# Patient Record
Sex: Female | Born: 1960 | Race: White | Hispanic: No | Marital: Single | Smoking: Former smoker
Health system: Southern US, Community
[De-identification: ages and names within clinical notes are randomized; demographics above are authoritative.]

## PROBLEM LIST (undated history)

## (undated) DIAGNOSIS — T7840XA Allergy, unspecified, initial encounter: Secondary | ICD-10-CM

## (undated) DIAGNOSIS — F419 Anxiety disorder, unspecified: Secondary | ICD-10-CM

## (undated) DIAGNOSIS — R200 Anesthesia of skin: Secondary | ICD-10-CM

## (undated) DIAGNOSIS — F329 Major depressive disorder, single episode, unspecified: Secondary | ICD-10-CM

## (undated) DIAGNOSIS — G43909 Migraine, unspecified, not intractable, without status migrainosus: Secondary | ICD-10-CM

## (undated) DIAGNOSIS — F32A Depression, unspecified: Secondary | ICD-10-CM

## (undated) DIAGNOSIS — M1990 Unspecified osteoarthritis, unspecified site: Secondary | ICD-10-CM

## (undated) HISTORY — DX: Depression, unspecified: F32.A

## (undated) HISTORY — DX: Anxiety disorder, unspecified: F41.9

## (undated) HISTORY — DX: Allergy, unspecified, initial encounter: T78.40XA

## (undated) HISTORY — PX: WISDOM TOOTH EXTRACTION: SHX21

## (undated) HISTORY — DX: Anesthesia of skin: R20.0

## (undated) HISTORY — DX: Migraine, unspecified, not intractable, without status migrainosus: G43.909

## (undated) HISTORY — DX: Unspecified osteoarthritis, unspecified site: M19.90

## (undated) HISTORY — PX: NO PAST SURGERIES: SHX2092

## (undated) HISTORY — DX: Major depressive disorder, single episode, unspecified: F32.9

---

## 1998-03-16 ENCOUNTER — Other Ambulatory Visit: Admission: RE | Admit: 1998-03-16 | Discharge: 1998-03-16 | Payer: Self-pay | Admitting: Obstetrics and Gynecology

## 1999-05-15 ENCOUNTER — Other Ambulatory Visit: Admission: RE | Admit: 1999-05-15 | Discharge: 1999-05-15 | Payer: Self-pay | Admitting: Obstetrics and Gynecology

## 1999-05-15 ENCOUNTER — Encounter (INDEPENDENT_AMBULATORY_CARE_PROVIDER_SITE_OTHER): Payer: Self-pay

## 2000-05-15 ENCOUNTER — Other Ambulatory Visit: Admission: RE | Admit: 2000-05-15 | Discharge: 2000-05-15 | Payer: Self-pay | Admitting: Obstetrics and Gynecology

## 2001-06-24 ENCOUNTER — Other Ambulatory Visit: Admission: RE | Admit: 2001-06-24 | Discharge: 2001-06-24 | Payer: Self-pay | Admitting: Obstetrics and Gynecology

## 2002-07-08 ENCOUNTER — Other Ambulatory Visit: Admission: RE | Admit: 2002-07-08 | Discharge: 2002-07-08 | Payer: Self-pay | Admitting: Obstetrics and Gynecology

## 2002-10-14 ENCOUNTER — Other Ambulatory Visit: Admission: RE | Admit: 2002-10-14 | Discharge: 2002-10-14 | Payer: Self-pay | Admitting: Obstetrics and Gynecology

## 2003-05-10 ENCOUNTER — Other Ambulatory Visit: Admission: RE | Admit: 2003-05-10 | Discharge: 2003-05-10 | Payer: Self-pay | Admitting: Obstetrics and Gynecology

## 2003-08-02 ENCOUNTER — Other Ambulatory Visit: Admission: RE | Admit: 2003-08-02 | Discharge: 2003-08-02 | Payer: Self-pay | Admitting: Obstetrics and Gynecology

## 2004-01-23 ENCOUNTER — Other Ambulatory Visit: Admission: RE | Admit: 2004-01-23 | Discharge: 2004-01-23 | Payer: Self-pay | Admitting: Obstetrics and Gynecology

## 2004-09-06 ENCOUNTER — Other Ambulatory Visit: Admission: RE | Admit: 2004-09-06 | Discharge: 2004-09-06 | Payer: Self-pay | Admitting: Obstetrics and Gynecology

## 2005-10-14 ENCOUNTER — Other Ambulatory Visit: Admission: RE | Admit: 2005-10-14 | Discharge: 2005-10-14 | Payer: Self-pay | Admitting: Obstetrics and Gynecology

## 2006-11-21 LAB — CONVERTED CEMR LAB: Pap Smear: NORMAL

## 2007-11-21 ENCOUNTER — Encounter: Payer: Self-pay | Admitting: Internal Medicine

## 2007-11-26 ENCOUNTER — Encounter: Payer: Self-pay | Admitting: Internal Medicine

## 2007-11-29 ENCOUNTER — Ambulatory Visit: Payer: Self-pay | Admitting: Internal Medicine

## 2007-11-29 DIAGNOSIS — M2560 Stiffness of unspecified joint, not elsewhere classified: Secondary | ICD-10-CM | POA: Insufficient documentation

## 2007-11-29 DIAGNOSIS — R635 Abnormal weight gain: Secondary | ICD-10-CM | POA: Insufficient documentation

## 2007-11-29 DIAGNOSIS — M25519 Pain in unspecified shoulder: Secondary | ICD-10-CM | POA: Insufficient documentation

## 2007-11-29 DIAGNOSIS — R87619 Unspecified abnormal cytological findings in specimens from cervix uteri: Secondary | ICD-10-CM | POA: Insufficient documentation

## 2007-11-29 LAB — CONVERTED CEMR LAB
Anti Nuclear Antibody(ANA): NEGATIVE
Bilirubin Urine: NEGATIVE
Blood in Urine, dipstick: NEGATIVE
Glucose, Urine, Semiquant: NEGATIVE
Ketones, urine, test strip: NEGATIVE
Nitrite: NEGATIVE
Specific Gravity, Urine: >=1.03
Urobilinogen, UA: 0.2
WBC Urine, dipstick: NEGATIVE
pH: 5

## 2007-12-02 LAB — CONVERTED CEMR LAB
ALT: 19 units/L (ref 0–35)
AST: 20 units/L (ref 0–37)
Albumin: 4.1 g/dL (ref 3.5–5.2)
Alkaline Phosphatase: 77 units/L (ref 39–117)
BUN: 17 mg/dL (ref 6–23)
Basophils Absolute: 0 10*3/uL (ref 0.0–0.1)
Basophils Relative: 0.6 % (ref 0.0–1.0)
Bilirubin, Direct: 0.2 mg/dL (ref 0.0–0.3)
CO2: 30 meq/L (ref 19–32)
CRP, High Sensitivity: 3 (ref 0.00–5.00)
Calcium: 9.8 mg/dL (ref 8.4–10.5)
Chloride: 107 meq/L (ref 96–112)
Cholesterol: 199 mg/dL (ref 0–200)
Creatinine, Ser: 0.9 mg/dL (ref 0.4–1.2)
Eosinophils Absolute: 0.1 10*3/uL (ref 0.0–0.6)
Eosinophils Relative: 1.5 % (ref 0.0–5.0)
Free T4: 0.8 ng/dL (ref 0.6–1.6)
GFR calc Af Amer: 87 mL/min
GFR calc non Af Amer: 72 mL/min
Glucose, Bld: 87 mg/dL (ref 70–99)
HCT: 41.3 % (ref 36.0–46.0)
HDL: 57.7 mg/dL (ref >39.0)
Hemoglobin: 14 g/dL (ref 12.0–15.0)
LDL Cholesterol: 130 mg/dL — ABNORMAL HIGH (ref 0–99)
Lymphocytes Relative: 36.8 % (ref 12.0–46.0)
MCHC: 33.8 g/dL (ref 30.0–36.0)
MCV: 91.5 fL (ref 78.0–100.0)
Monocytes Absolute: 0.5 10*3/uL (ref 0.2–0.7)
Monocytes Relative: 8.3 % (ref 3.0–11.0)
Neutro Abs: 3.3 10*3/uL (ref 1.4–7.7)
Neutrophils Relative %: 52.8 % (ref 43.0–77.0)
Platelets: 290 10*3/uL (ref 150–400)
Potassium: 4.5 meq/L (ref 3.5–5.1)
RBC: 4.52 M/uL (ref 3.87–5.11)
RDW: 12.2 % (ref 11.5–14.6)
Rhuematoid fact SerPl-aCnc: <20 intl units/mL — ABNORMAL LOW (ref 0.0–20.0)
Sodium: 142 meq/L (ref 135–145)
TSH: 2.01 microintl units/mL (ref 0.35–5.50)
Total Bilirubin: 0.8 mg/dL (ref 0.3–1.2)
Total CHOL/HDL Ratio: 3.4
Total CK: 95 units/L (ref 7–177)
Total Protein: 6.6 g/dL (ref 6.0–8.3)
Triglycerides: 57 mg/dL (ref 0–149)
VLDL: 11 mg/dL (ref 0–40)
WBC: 6.1 10*3/uL (ref 4.5–10.5)

## 2007-12-07 ENCOUNTER — Encounter: Admission: RE | Admit: 2007-12-07 | Discharge: 2007-12-07 | Payer: Self-pay | Admitting: Internal Medicine

## 2007-12-13 ENCOUNTER — Telehealth: Payer: Self-pay | Admitting: Internal Medicine

## 2007-12-13 ENCOUNTER — Telehealth: Payer: Self-pay | Admitting: *Deleted

## 2007-12-14 ENCOUNTER — Telehealth (INDEPENDENT_AMBULATORY_CARE_PROVIDER_SITE_OTHER): Payer: Self-pay | Admitting: *Deleted

## 2007-12-23 ENCOUNTER — Telehealth: Payer: Self-pay | Admitting: Internal Medicine

## 2007-12-23 ENCOUNTER — Encounter: Payer: Self-pay | Admitting: Internal Medicine

## 2007-12-28 ENCOUNTER — Encounter: Payer: Self-pay | Admitting: Internal Medicine

## 2007-12-28 ENCOUNTER — Ambulatory Visit: Payer: Self-pay | Admitting: Internal Medicine

## 2007-12-28 ENCOUNTER — Other Ambulatory Visit: Admission: RE | Admit: 2007-12-28 | Discharge: 2007-12-28 | Payer: Self-pay | Admitting: Internal Medicine

## 2008-01-06 ENCOUNTER — Telehealth (INDEPENDENT_AMBULATORY_CARE_PROVIDER_SITE_OTHER): Payer: Self-pay | Admitting: *Deleted

## 2008-12-24 IMAGING — MG MM SCREEN MAMMOGRAM BILATERAL
4 series · 4 of 4 positions shown · non-contrast
Comparison: Prior studies.

DG SCREEN MAMMOGRAM BILATERAL
Bilateral CC and MLO view(s) were taken.

DIGITAL SCREENING MAMMOGRAM WITH CAD:

[R CC]
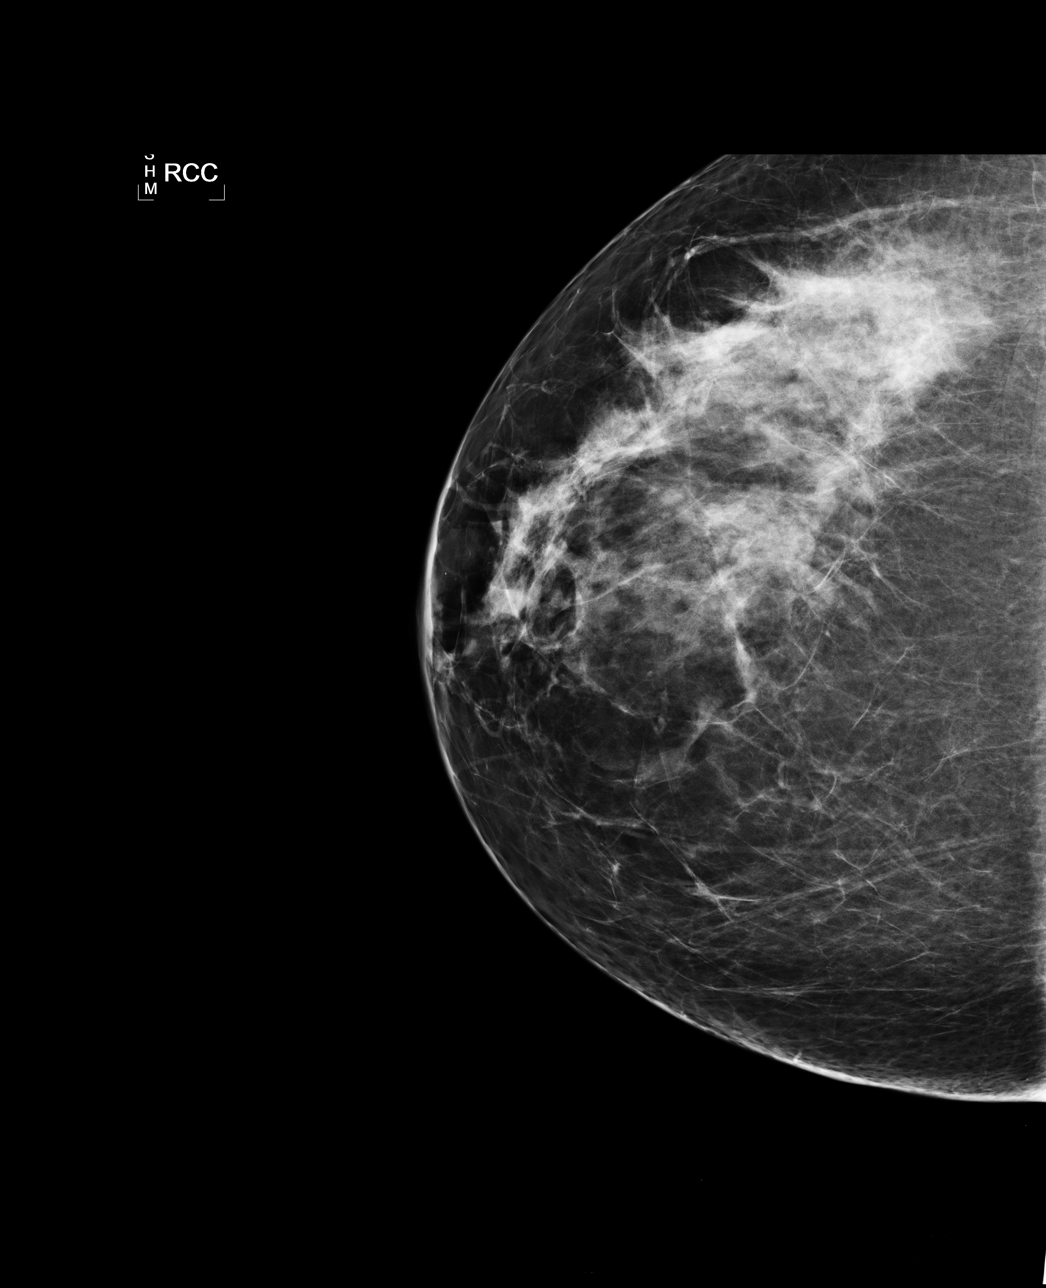

[L CC]
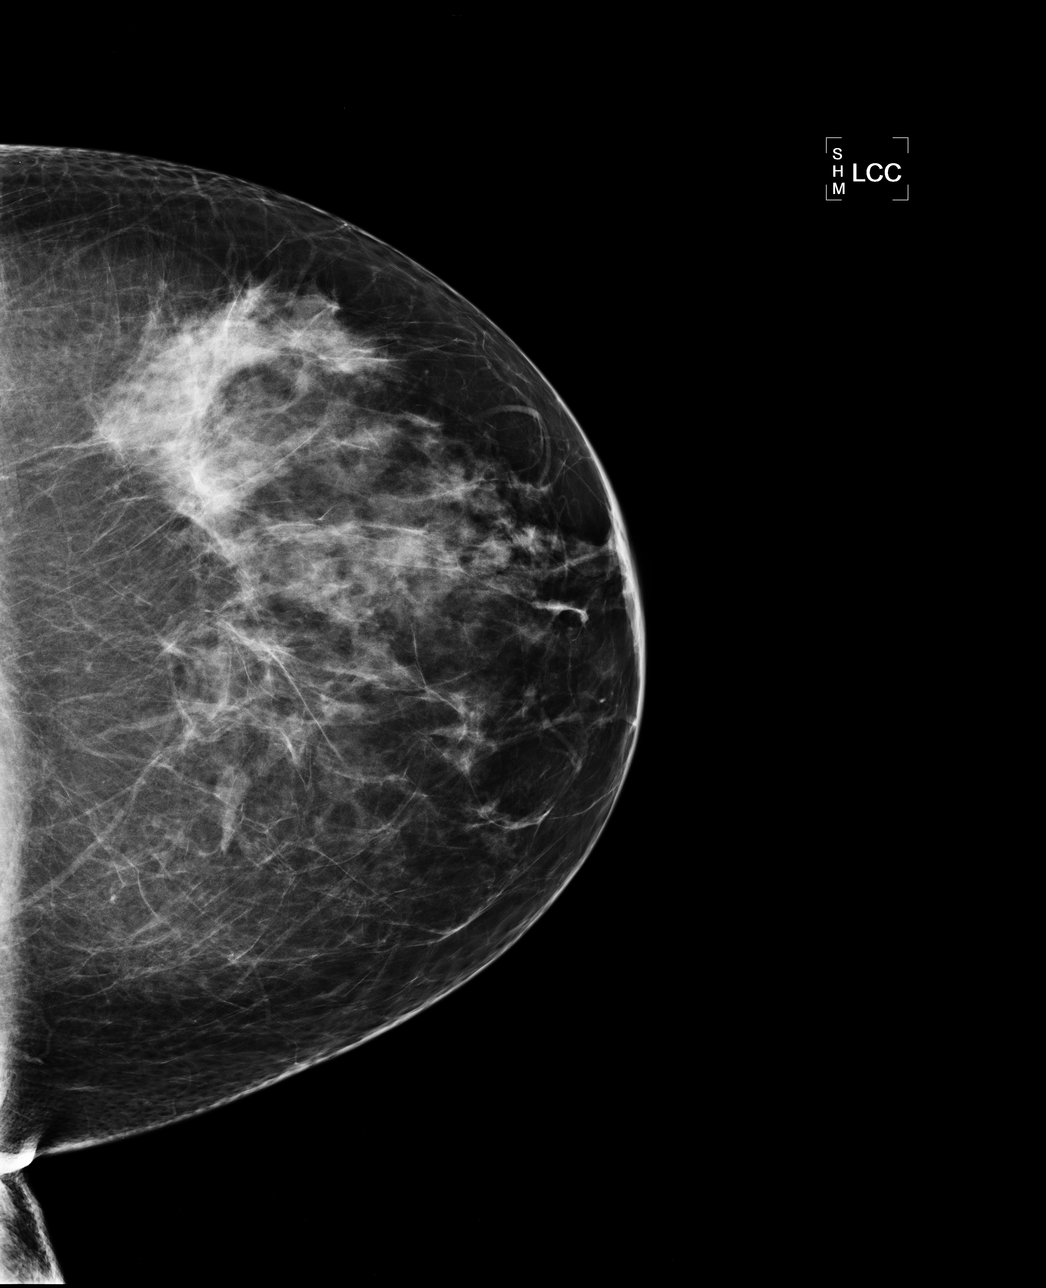

[L MLO]
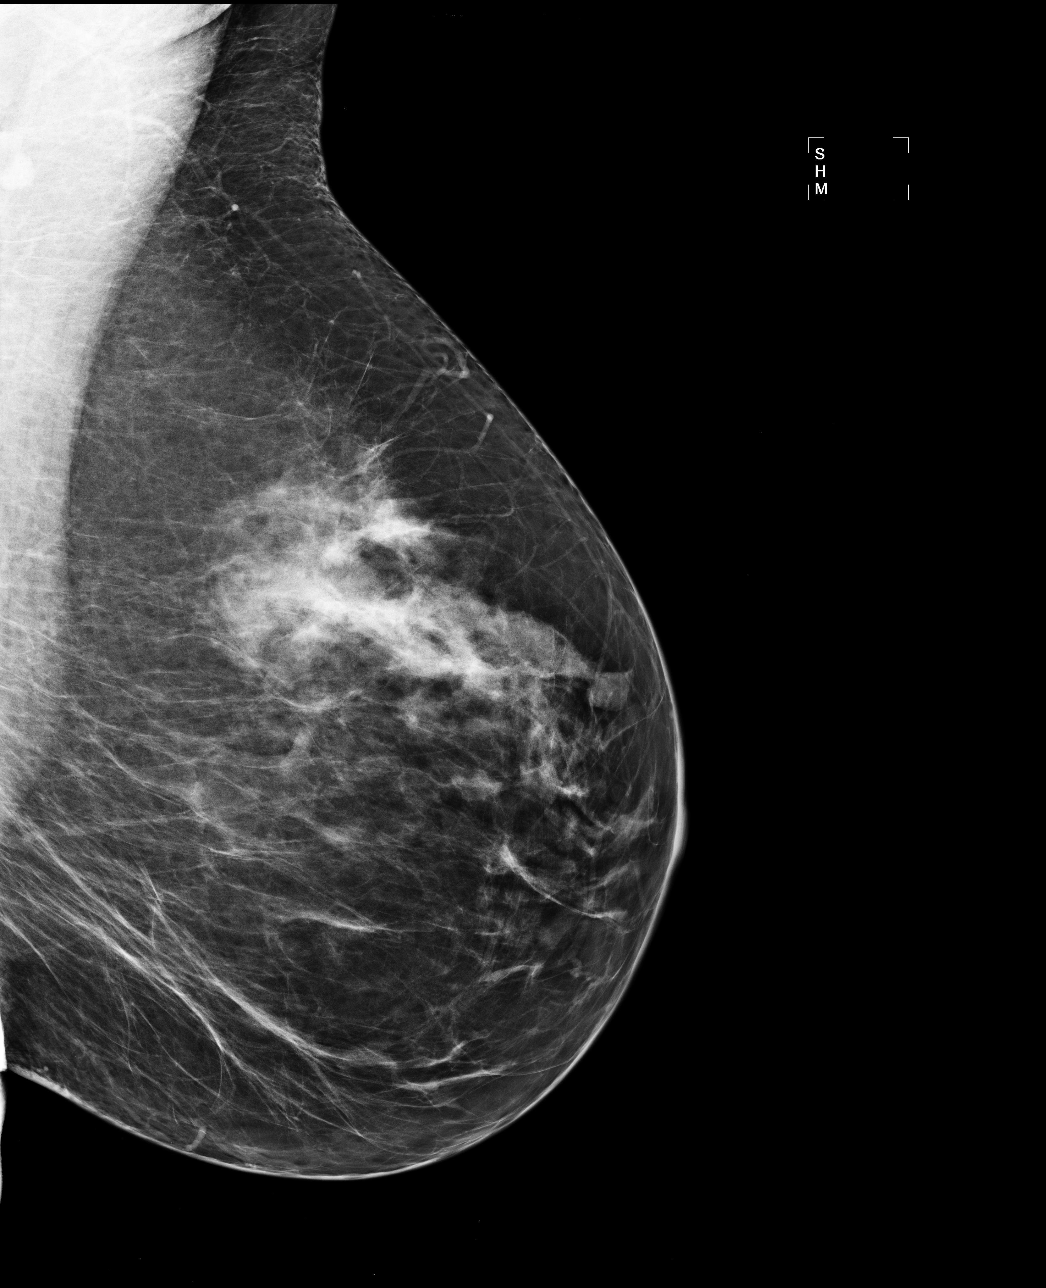

[R MLO]
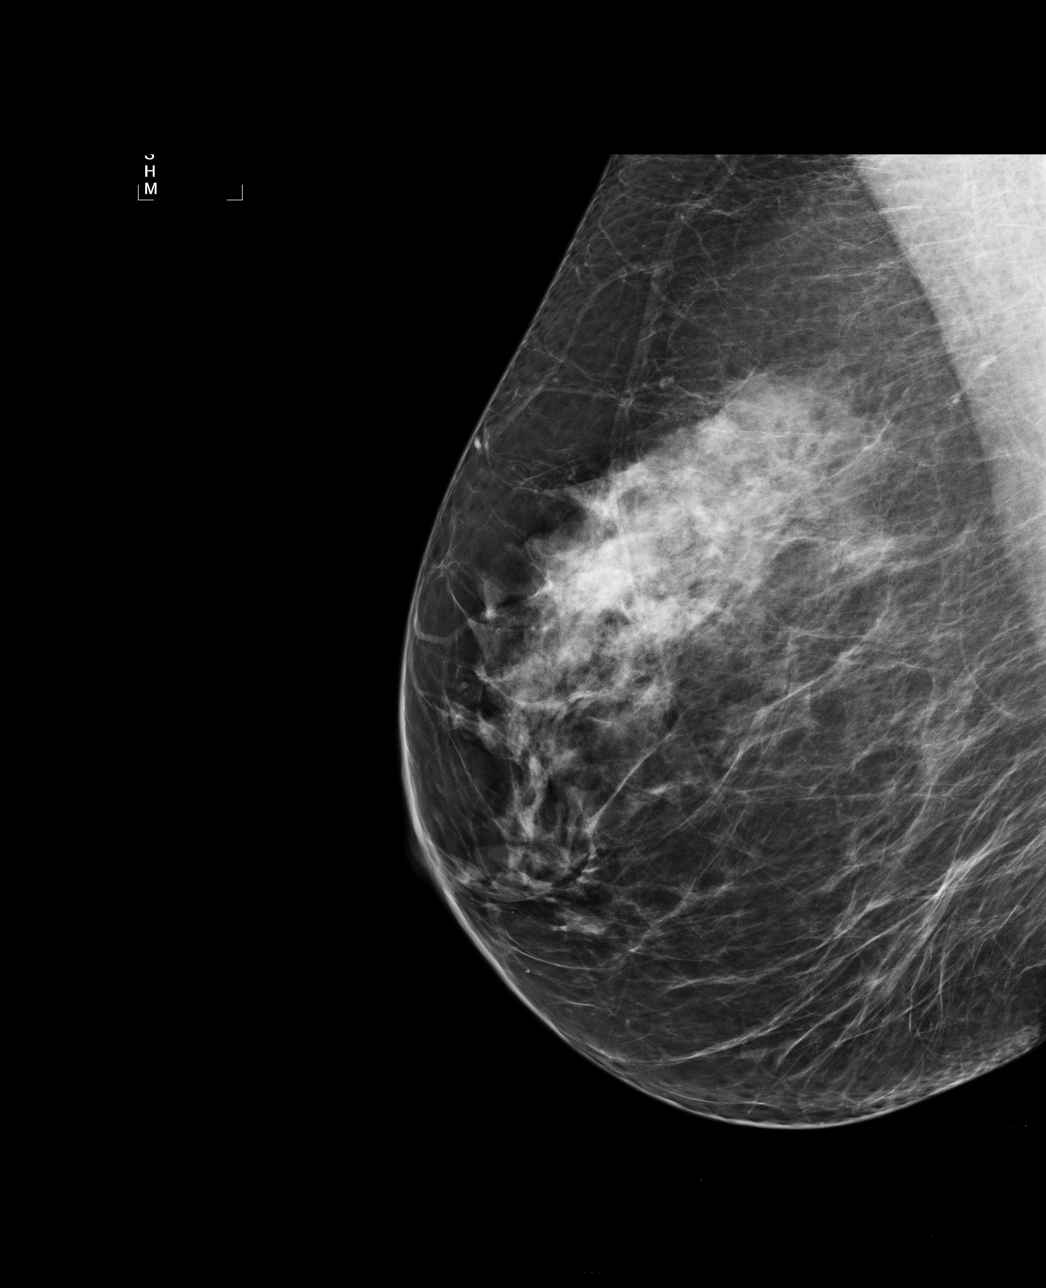

[4 of 4 positions shown; findings below may reference images not displayed]

The breast tissue is heterogeneously dense.  There is no dominant mass, architectural distortion or
calcification to suggest malignancy.
IMPRESSION: No mammographic evidence of malignancy.  Suggest yearly screening mammography.

ASSESSMENT: Negative - BI-RADS 1

Screening mammogram in 1 year.
ANALYZED BY COMPUTER AIDED DETECTION. , THIS PROCEDURE WAS A DIGITAL MAMMOGRAM.

## 2009-01-12 ENCOUNTER — Encounter: Admission: RE | Admit: 2009-01-12 | Discharge: 2009-01-12 | Payer: Self-pay | Admitting: Internal Medicine

## 2009-02-14 ENCOUNTER — Encounter: Payer: Self-pay | Admitting: Cardiology

## 2009-02-14 ENCOUNTER — Encounter: Payer: Self-pay | Admitting: Internal Medicine

## 2009-02-14 ENCOUNTER — Ambulatory Visit: Payer: Self-pay | Admitting: Internal Medicine

## 2009-02-14 ENCOUNTER — Other Ambulatory Visit: Admission: RE | Admit: 2009-02-14 | Discharge: 2009-02-14 | Payer: Self-pay | Admitting: Internal Medicine

## 2009-02-14 DIAGNOSIS — R0789 Other chest pain: Secondary | ICD-10-CM | POA: Insufficient documentation

## 2009-03-21 ENCOUNTER — Ambulatory Visit: Payer: Self-pay | Admitting: Cardiology

## 2009-04-27 ENCOUNTER — Ambulatory Visit: Payer: Self-pay

## 2009-04-27 ENCOUNTER — Encounter: Payer: Self-pay | Admitting: Cardiology

## 2009-12-26 ENCOUNTER — Telehealth: Payer: Self-pay | Admitting: *Deleted

## 2010-02-14 ENCOUNTER — Encounter: Admission: RE | Admit: 2010-02-14 | Discharge: 2010-02-14 | Payer: Self-pay | Admitting: Internal Medicine

## 2010-03-13 ENCOUNTER — Ambulatory Visit: Payer: Self-pay | Admitting: Internal Medicine

## 2010-03-13 LAB — CONVERTED CEMR LAB
ALT: 18 units/L (ref 0–35)
AST: 20 units/L (ref 0–37)
Albumin: 4 g/dL (ref 3.5–5.2)
Alkaline Phosphatase: 73 units/L (ref 39–117)
BUN: 19 mg/dL (ref 6–23)
Basophils Absolute: 0.1 10*3/uL (ref 0.0–0.1)
Basophils Relative: 0.8 % (ref 0.0–3.0)
Bilirubin Urine: NEGATIVE
Bilirubin, Direct: 0.1 mg/dL (ref 0.0–0.3)
Blood in Urine, dipstick: NEGATIVE
CO2: 31 meq/L (ref 19–32)
Calcium: 9.3 mg/dL (ref 8.4–10.5)
Chloride: 108 meq/L (ref 96–112)
Cholesterol: 188 mg/dL (ref 0–200)
Creatinine, Ser: 0.8 mg/dL (ref 0.4–1.2)
Eosinophils Absolute: 0.1 10*3/uL (ref 0.0–0.7)
Eosinophils Relative: 2.2 % (ref 0.0–5.0)
GFR calc non Af Amer: 77.69 mL/min (ref >60)
Glucose, Bld: 80 mg/dL (ref 70–99)
Glucose, Urine, Semiquant: NEGATIVE
HCT: 38.3 % (ref 36.0–46.0)
HDL: 60.5 mg/dL (ref >39.00)
Hemoglobin: 13.4 g/dL (ref 12.0–15.0)
Ketones, urine, test strip: NEGATIVE
LDL Cholesterol: 115 mg/dL — ABNORMAL HIGH (ref 0–99)
Lymphocytes Relative: 39.4 % (ref 12.0–46.0)
Lymphs Abs: 2.5 10*3/uL (ref 0.7–4.0)
MCHC: 34.8 g/dL (ref 30.0–36.0)
MCV: 95.1 fL (ref 78.0–100.0)
Monocytes Absolute: 0.4 10*3/uL (ref 0.1–1.0)
Monocytes Relative: 7.1 % (ref 3.0–12.0)
Neutro Abs: 3.2 10*3/uL (ref 1.4–7.7)
Neutrophils Relative %: 50.5 % (ref 43.0–77.0)
Nitrite: NEGATIVE
Platelets: 258 10*3/uL (ref 150.0–400.0)
Potassium: 4.8 meq/L (ref 3.5–5.1)
Protein, U semiquant: NEGATIVE
RBC: 4.03 M/uL (ref 3.87–5.11)
RDW: 13 % (ref 11.5–14.6)
Sodium: 144 meq/L (ref 135–145)
Specific Gravity, Urine: 1.025
TSH: 4.32 microintl units/mL (ref 0.35–5.50)
Total Bilirubin: 0.5 mg/dL (ref 0.3–1.2)
Total CHOL/HDL Ratio: 3
Total Protein: 6.8 g/dL (ref 6.0–8.3)
Triglycerides: 63 mg/dL (ref 0.0–149.0)
Urobilinogen, UA: 0.2
VLDL: 12.6 mg/dL (ref 0.0–40.0)
WBC Urine, dipstick: NEGATIVE
WBC: 6.3 10*3/uL (ref 4.5–10.5)
pH: 6

## 2010-03-19 ENCOUNTER — Ambulatory Visit: Payer: Self-pay | Admitting: Internal Medicine

## 2010-03-19 ENCOUNTER — Other Ambulatory Visit: Admission: RE | Admit: 2010-03-19 | Discharge: 2010-03-19 | Payer: Self-pay | Admitting: Internal Medicine

## 2010-03-19 DIAGNOSIS — N951 Menopausal and female climacteric states: Secondary | ICD-10-CM | POA: Insufficient documentation

## 2010-03-20 ENCOUNTER — Telehealth: Payer: Self-pay | Admitting: *Deleted

## 2010-03-22 LAB — CONVERTED CEMR LAB: Pap Smear: NEGATIVE

## 2010-03-26 ENCOUNTER — Telehealth: Payer: Self-pay | Admitting: Internal Medicine

## 2010-03-27 ENCOUNTER — Encounter (INDEPENDENT_AMBULATORY_CARE_PROVIDER_SITE_OTHER): Payer: Self-pay | Admitting: *Deleted

## 2010-04-01 ENCOUNTER — Encounter: Payer: Self-pay | Admitting: Gastroenterology

## 2010-04-08 ENCOUNTER — Encounter: Payer: Self-pay | Admitting: Internal Medicine

## 2010-04-09 ENCOUNTER — Encounter (INDEPENDENT_AMBULATORY_CARE_PROVIDER_SITE_OTHER): Payer: Self-pay | Admitting: *Deleted

## 2010-04-11 ENCOUNTER — Telehealth: Payer: Self-pay | Admitting: Internal Medicine

## 2010-04-11 ENCOUNTER — Ambulatory Visit: Payer: Self-pay | Admitting: Gastroenterology

## 2010-04-11 ENCOUNTER — Telehealth: Payer: Self-pay | Admitting: *Deleted

## 2010-04-19 ENCOUNTER — Ambulatory Visit: Payer: Self-pay | Admitting: Gastroenterology

## 2010-04-19 HISTORY — PX: COLONOSCOPY: SHX174

## 2010-05-06 ENCOUNTER — Telehealth: Payer: Self-pay | Admitting: *Deleted

## 2010-05-08 ENCOUNTER — Telehealth: Payer: Self-pay | Admitting: *Deleted

## 2010-07-29 ENCOUNTER — Telehealth: Payer: Self-pay | Admitting: *Deleted

## 2010-08-05 ENCOUNTER — Encounter: Payer: Self-pay | Admitting: Internal Medicine

## 2010-08-07 ENCOUNTER — Encounter: Payer: Self-pay | Admitting: *Deleted

## 2010-10-20 LAB — CONVERTED CEMR LAB
ALT: 18 units/L (ref 0–35)
AST: 27 units/L (ref 0–37)
Albumin: 4.1 g/dL (ref 3.5–5.2)
Alkaline Phosphatase: 73 units/L (ref 39–117)
BUN: 22 mg/dL (ref 6–23)
Basophils Absolute: 0 10*3/uL (ref 0.0–0.1)
Basophils Relative: 0.2 % (ref 0.0–3.0)
Bilirubin, Direct: 0 mg/dL (ref 0.0–0.3)
Blood in Urine, dipstick: NEGATIVE
CO2: 29 meq/L (ref 19–32)
Calcium: 9.2 mg/dL (ref 8.4–10.5)
Chloride: 113 meq/L — ABNORMAL HIGH (ref 96–112)
Cholesterol: 164 mg/dL (ref 0–200)
Creatinine, Ser: 0.8 mg/dL (ref 0.4–1.2)
Eosinophils Absolute: 0.1 10*3/uL (ref 0.0–0.7)
Eosinophils Relative: 1.4 % (ref 0.0–5.0)
GFR calc non Af Amer: 81.42 mL/min (ref >60)
Glucose, Bld: 74 mg/dL (ref 70–99)
Glucose, Urine, Semiquant: NEGATIVE
HCT: 38.6 % (ref 36.0–46.0)
HDL: 52.3 mg/dL (ref >39.00)
Hemoglobin: 13.4 g/dL (ref 12.0–15.0)
LDL Cholesterol: 99 mg/dL (ref 0–99)
Lymphocytes Relative: 31.5 % (ref 12.0–46.0)
Lymphs Abs: 2 10*3/uL (ref 0.7–4.0)
MCHC: 34.7 g/dL (ref 30.0–36.0)
MCV: 94 fL (ref 78.0–100.0)
Monocytes Absolute: 0.4 10*3/uL (ref 0.1–1.0)
Monocytes Relative: 6 % (ref 3.0–12.0)
Neutro Abs: 4 10*3/uL (ref 1.4–7.7)
Neutrophils Relative %: 60.9 % (ref 43.0–77.0)
Nitrite: NEGATIVE
Platelets: 240 10*3/uL (ref 150.0–400.0)
Potassium: 4 meq/L (ref 3.5–5.1)
Protein, U semiquant: NEGATIVE
RBC: 4.1 M/uL (ref 3.87–5.11)
RDW: 12.2 % (ref 11.5–14.6)
Sodium: 144 meq/L (ref 135–145)
Specific Gravity, Urine: >=1.03
TSH: 2.57 microintl units/mL (ref 0.35–5.50)
Total Bilirubin: 0.8 mg/dL (ref 0.3–1.2)
Total CHOL/HDL Ratio: 3
Total Protein: 6.8 g/dL (ref 6.0–8.3)
Triglycerides: 63 mg/dL (ref 0.0–149.0)
Urobilinogen, UA: 0.2
VLDL: 12.6 mg/dL (ref 0.0–40.0)
WBC Urine, dipstick: NEGATIVE
WBC: 6.5 10*3/uL (ref 4.5–10.5)
pH: 5

## 2010-10-24 NOTE — Assessment & Plan Note (Signed)
Summary: pap and pelvic per dr.p to use a rov slot/nta   Vital Signs:  Patient Profile:   50 Years Old Female Height:     63 inches Weight:      170 pounds Pulse rate:   72 / minute BP sitting:   100 / 60  (right arm) Cuff size:   regular  Vitals Entered By: Romualdo Bolk, CMA (December 28, 2007 8:17 AM)                 Chief Complaint:  Pap and Pelvic.  History of Present Illness: Melody Duran is here for a pap. LMP: 5 years ago.  NO gyne problems  at present  see past note for ov. Has seen ortho and going to pt for shoulder   needs order for back pt as insurance wont take it from ortho!.   Still trying to ride horse on weekend but sometimes a problem.. Feels achy all over and stiff in am .. does have a lot of tick exposures [place] in [place]   no hx of tick rashes   To go over labs today      Prior Medications Reviewed Using: Patient Recall  Updated Prior Medication List: NAPROXEN DR 500 MG  TBEC (NAPROXEN) 1 two times a day  Current Allergies (reviewed today): ! [facility]  Past Medical History:    Reviewed history from 11/29/2007 and no changes required:       hx abnormal over a year ago. pap     normal x 3 and now on yearly PAPs.       prob had chickenpox as a child       Migraines  Past Surgical History:    Reviewed history from 11/29/2007 and no changes required:       Denies surgical history   Family History:    Reviewed history from 11/29/2007 and no changes required:       gm   age 67 Massive MI       Both parents with MIs   Mom 79s  smokes        Mom breast cancer   42       Both parents  had colon cancer.         PGF dm    MI        GM had arthritis              No sibs        ?RA in mom.         Family History of CAD Female 1st degree relative <60       Family History of CAD Female 1st degree relative <50       Family History of Colon CA 1st degree relative <60  Social History:    Reviewed history from 11/29/2007 and no changes  required:       Single       Former Smoker       Alcohol use-yes   rare       Drug use-no       Regular exercise-no       hh1 4 dogs and 2 horses  not allergic       rides horses    Review of Systems  The patient denies anorexia, fever, weight loss, severe indigestion/heartburn, hematuria, muscle weakness, and abnormal bleeding.     Physical Exam  General:     Well-developed,well-nourished,in no acute distress; alert,appropriate and  cooperative throughout examination Head:     normocephalic and atraumatic.   Neck:     No deformities, masses, or tenderness noted. Breasts:     No mass, nodules, thickening, tenderness, bulging, retraction, inflamation, nipple discharge or skin changes noted.   Lungs:     normal respiratory effort, no intercostal retractions, and no accessory muscle use.   Heart:     normal rate and regular rhythm.   Abdomen:     Bowel sounds positive,abdomen soft and non-tender without masses, organomegaly or hernias noted. Rectal:     No external abnormalities noted. Normal sphincter tone. No rectal masses or tenderness. Genitalia:     Pelvic Exam:        External: normal female genitalia without lesions or masses        Vagina: normal without lesions or masses        Cervix: normal without lesions or masses        Adnexa: normal bimanual exam without masses or fullness        Uterus: normal by palpation        Pap smear: performed Msk:     no joint swelling, no joint warmth, and no redness over joints.   Neurologic:     grossly nf Skin:     area of tick bit llb without rash and no imbedded tick  Cervical Nodes:     no anterior cervical adenopathy and no posterior cervical adenopathy.   Axillary Nodes:     No palpable lymphadenopathy Inguinal Nodes:     No significant adenopathy Additional Exam:     see labs normal crp is 5 range    Impression & Recommendations:  Problem # 1:  JOINT STIFFNESS (ICD-719.50) no explanation and but rec pt for  back and shoulder and ok to decrease horse riding for now and see if sleep in proves and thus pain ..consider pheum appt    no stigmata of lyme although has outdoor exposure.  consider check vit d level if continuing but supplement currently  Problem # 2:  ROUTINE GYNECOLOGICAL EXAMINATION (ICD-V72.31) nomral exam today  Problem # 3:  PAP SMEAR, ABNORMAL (ICD-795.00)  hx of normal now Assessment: Comment Only  Complete Medication List: 1)  Naproxen Dr 500 Mg Tbec (Naproxen) .Marland Kitchen.. 1 two times a day   Patient Instructions: 1)  continue with pt for now  and call or ov in 1 months consider  rheum appt as aneeded 2)  take vit d 1000-2000iu per day also . 3)  call if fever rash other change in status    ]

## 2010-10-24 NOTE — Miscellaneous (Signed)
Summary: direct colon//family hx of colon cancer--ch.  Clinical Lists Changes  Medications: Added new medication of MOVIPREP 100 GM  SOLR (PEG-KCL-NACL-NASULF-NA ASC-C) As directed - Signed Rx of MOVIPREP 100 GM  SOLR (PEG-KCL-NACL-NASULF-NA ASC-C) As directed;  #1 x 0;  Signed;  Entered by: Clide Cliff RN;  Authorized by: Mardella Layman MD [facility];  Method used: Electronically to [facility] [id number]*, [address] [place], [place], [place]  [address], Ph: [phone] or [phone], Fax: [phone] Observations: Added new observation of ALLERGY REV: Done (04/11/2010 12:46)    Prescriptions: MOVIPREP 100 GM  SOLR (PEG-KCL-NACL-NASULF-NA ASC-C) As directed  #1 x 0   Entered by:   Clide Cliff RN   Authorized by:   Mardella Layman MD [facility]   Signed by:   Clide Cliff RN on 04/11/2010   Method used:   Electronically to        [facility] [address] [phone]* (retail)       [address]       [place], [place]  [address]       Ph: [phone] or [phone]       Fax: [phone]   RxID:   [id number]

## 2010-10-24 NOTE — Consult Note (Signed)
Summary: Consultation Report  Consultation Report   Imported By: Kassie Mends 12/28/2007 09:25:57  _____________________________________________________________________  External Attachment:    Type:   Image     Comment:   Dr Penni Bombard note

## 2010-10-24 NOTE — Procedures (Signed)
Summary: Colonoscopy  Patient: Melody Duran Note: All result statuses are Final unless otherwise noted.  Tests: (1) Colonoscopy (COL)   COL Colonoscopy           DONE     [facility]     [address]     [place], [place]  [address]           COLONOSCOPY PROCEDURE REPORT           PATIENT:  Melody Duran  MR#:  [id number]     BIRTHDATE:  06-16-1961, 49 yrs. old  GENDER:  female     ENDOSCOPIST:  Vania Rea. Jarold Motto, MD, [facility]     REF. BY:  Neta Mends. Panosh, M.D.     PROCEDURE DATE:  04/19/2010     PROCEDURE:  Higher-risk screening colonoscopy G0105           ASA CLASS:  Class II     INDICATIONS:  Elevated Risk Screening BOTH PARENTS WITH HX. OF     COLON CA.     MEDICATIONS:   Fentanyl 50 mcg IV, Versed 6 mg IV           DESCRIPTION OF PROCEDURE:   After the risks benefits and     alternatives of the procedure were thoroughly explained, informed     consent was obtained.  Digital rectal exam was performed and     revealed no abnormalities.   The LB [id number] endoscope     was introduced through the anus and advanced to the cecum, which     was identified by both the appendix and ileocecal valve, without     limitations.  The quality of the prep was excellent, using     MoviPrep.  The instrument was then slowly withdrawn as the colon     was fully examined.     <<PROCEDUREIMAGES>>           FINDINGS:  No polyps or cancers were seen.  This was otherwise a     normal examination of the colon.   Retroflexed views in the rectum     revealed no abnormalities.    The scope was then withdrawn from     the patient and the procedure completed.           COMPLICATIONS:  None     ENDOSCOPIC IMPRESSION:     1) No polyps or cancers     2) Otherwise normal examination     RECOMMENDATIONS:     1) Given your significant family history of colon cancer, you     should have a repeat colonoscopy in 5 years     REPEAT EXAM:  No           ______________________________  Vania Rea. Jarold Motto, MD, [facility]           CC:           n.     eSIGNED:   Vania Rea. Cliffton Spradley at 04/19/2010 04:01 PM           Adela Lank, [id number]  Note: An exclamation mark (!) indicates a result that was not dispersed into the flowsheet. Document Creation Date: 04/19/2010 4:03 PM _______________________________________________________________________  (1) Order result status: Final Collection or observation date-time: 04/19/2010 15:53 Requested date-time:  Receipt date-time:  Reported date-time:  Referring Physician:   Ordering Physician: Sheryn Bison [phone]) Specimen Source:  Source: Launa Grill Order Number: [id number] Lab site:   Appended Document: Colonoscopy  Clinical Lists Changes  Observations: Added new observation of COLONNXTDUE: 03/2015 (04/19/2010 16:38)

## 2010-10-24 NOTE — Miscellaneous (Signed)
Summary: Flu Shot/[facility]  Flu Shot/[facility]   Imported By: Maryln Gottron 08/12/2010 12:19:56  _____________________________________________________________________  External Attachment:    Type:   Image     Comment:   External Document

## 2010-10-24 NOTE — Assessment & Plan Note (Signed)
Summary: cpx/pap/pt will come in fasting/njr   Vital Signs:  Patient profile:   50 year old female Menstrual status:  postmenopausal Height:      63 inches Weight:      164 pounds BMI:     29.16 Pulse rate:   66 / minute BP sitting:   120 / 80  (right arm) Cuff size:   regular  Vitals Entered By: Romualdo Bolk, CMA (Feb 14, 2009 10:46 AM) CC: CPX with pap LMP - Character: 5 years ago Menarche (age onset): 12 years   days  Menstrual Status postmenopausal Last PAP Result Normal   History of Present Illness: Melody Duran comes in [place] for CPX  .  Interim hx   :  ocass chest tightness and hard to breath and anxiety.  ? if panic attack. No cough  or racing heart noted.    Lasted about  3-4 minutes and felt bad later  .    carrying bucket of   water for horse.  riding . 3 times   sitting watching tv.   Assoicated sob but no sweating.  no radiation . felt like pressure and squeezing and made her feel scared.  No hx of panic attacks and not in family   Also  horse slipped in wet and she fell hurting her back and sore . walking ok and feels as she will improve  just stiff.   NO er visit s or other changes since last visit .     Preventive Screening-Counseling & Management  Alcohol-Tobacco     Alcohol drinks/day: 0     Smoking Status: quit     Year Quit: 20 years ago  Caffeine-Diet-Exercise     Caffeine use/day: 1     Does Patient Exercise: yes     Type of exercise: walking  Hep-HIV-STD-Contraception     Dental Visit-last 6 months yes     Sun Exposure-Excessive: yes   tanning beds      Sun Exposure Counseling: to decrease sun exposure  Safety-Violence-Falls     Seat Belt Use: yes     Firearms in the Home: firearms in the home     Firearm Counseling: to practice firearm safety     Smoke Detectors: yes      Blood Transfusions:  no.    EKG  Procedure date:  02/14/2009  Findings:      Normal sinus rhythm with rate of:  67  Current Medications (verified): 1)   Naproxen Dr 500 Mg  Tbec (Naproxen) .Marland Kitchen.. 1 Two Times A Day  Allergies (verified): 1)  ! Effexor  Past History:  Past medical, surgical, family and social histories (including risk factors) reviewed, and no changes noted (except as noted below).  Past Medical History: Reviewed history from 11/29/2007 and no changes required. hx abnormal over a year ago. pap     normal x 3 and now on yearly PAPs. prob had chickenpox as a child Migraines  Past Surgical History: Reviewed history from 11/29/2007 and no changes required. Denies surgical history  Family History: Reviewed history from 11/29/2007 and no changes required. Mgm   age 54 Massive MI Both parents with MIs   Mom 76s  smokes  Mom breast cancer   48 Both parents  had colon cancer.   PGF dm    MI  GM had arthritis No sibs  ?RA in mom.   Family History of CAD Female 1st degree relative <60 Family History of CAD Female 1st degree  relative <50 Family History of Colon CA 1st degree relative <60  Social History: Reviewed history from 12/28/2007 and no changes required. 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 Caffeine use/day:  1 Does Patient Exercise:  yes Dental Care w/in 6 mos.:  yes Sun Exposure-Excessive:  yes   tanning beds  Seat Belt Use:  yes Blood Transfusions:  no  Review of Systems  The patient denies anorexia, fever, weight loss, weight gain, vision loss, decreased hearing, hoarseness, syncope, peripheral edema, prolonged cough, headaches, hemoptysis, abdominal pain, melena, hematochezia, severe indigestion/heartburn, hematuria, incontinence, muscle weakness, transient blindness, unusual weight change, abnormal bleeding, enlarged lymph nodes, angioedema, and breast masses.   Physical Exam General Appearance: well developed, well nourished, no acute distress Eyes: conjunctiva  and lids normal, PERRLA, EOMI,  WNL Ears, Nose, Mouth, Throat: TM clear, nares clear, oral exam WNL Neck: supple, no lymphadenopathy, no thyromegaly, no JVD Respiratory: clear to auscultation and percussion, respiratory effort normal Cardiovascular: regular rate and rhythm, S1-S2, no murmur, rub or gallop, no bruits, peripheral pulses normal and symmetric, no cyanosis, clubbing, edema or varicosities Chest: no scars, masses, tenderness; no asymmetry, skin changes, nipple discharge   Gastrointestinal: soft, non-tender; no hepatosplenomegaly, masses; active bowel sounds all quadrants, guaiac negative stool; no masses, tenderness, hemorrhoids  Genitourinary: no vaginal discharge, lesions; no masses or tenderness pap done  Lymphatic: no cervical, axillary or inguinal adenopathy Musculoskeletal: gait normal, muscle tone and strength WNL, no joint swelling, effusions, discoloration, crepitus  she does have some back pain but gait ok  Skin: clear, good turgor, color WNL, no rashes, lesions, or ulcerations Neurologic: normal mental status, normal reflexes, normal strength, sensation, and motion Psychiatric: alert; oriented to person, place and time Other Exam:  EKG NSR no acut changes    Impression & Recommendations:  Problem # 1:  PHYSICAL EXAMINATION (ICD-V70.0) Discussed nutrition,exercise,diet,healthy weight, vitamin D and calcium.  Orders: Venipuncture (60454) TLB-BMP (Basic Metabolic Panel-BMET) (80048-METABOL) TLB-Lipid Panel (80061-LIPID) TLB-Hepatic/Liver Function Pnl (80076-HEPATIC) TLB-CBC Platelet - w/Differential (85025-CBCD) TLB-TSH (Thyroid Stimulating Hormone) (84443-TSH) UA Dipstick w/o Micro (automated)  (81003)  Problem # 2:  ROUTINE GYNECOLOGICAL EXAMINATION (ICD-V72.31)  pap done  Orders: Obtaining Screening PAP Smear (U9811)  Problem # 3:  CHEST DISCOMFORT (ICD-786.59)  although exam and ekg are normal  . one episode of discomfort  was with acitivity and doesnt  seem like panic  and are assoicate with dypnea .  . she has somewhat  premature menopause  early 37s and  stong family hx of Premature CAD in both parents  I think she should have more evaluation  and options discussed   .   Will refer for myoview  and if continuing  card consult.  get labs today  also  Orders: EKG w/ Interpretation (93000) Cardiology Referral (Cardiology)  Problem # 4:  FAMILY HISTORY OF CAD FEMALE 1ST DEGREE RELATIVE <60 (ICD-V16.49)  mom had mi  in early 78s  father early 47s   Orders: EKG w/ Interpretation (93000) Cardiology Referral (Cardiology)  Complete Medication List: 1)  Naproxen Dr 500 Mg Tbec (Naproxen) .Marland Kitchen.. 1 two times a day  Patient Instructions: 1)  You will be informed of lab results when available.  2)  Will plan  cardiology evaluation ( stress test)  although you EKG is normal. 3)  take 1000iu vit d per day.  Preventive Care Screening  Last Tetanus Booster:    Date:  09/23/2007    Results:  Historical   Prior Values:    Pap Smear:  Normal (11/21/2006)    Mammogram:  ASSESSMENT: Negative - BI-RADS 1^MM DIGITAL SCREENING (01/12/2009)   Laboratory Results   Urine Tests    Routine Urinalysis   Color: yellow Appearance: Clear Glucose: negative   (Normal Range: Negative) Bilirubin: 1+   (Normal Range: Negative) Ketone: 1+   (Normal Range: Negative) Spec. Gravity: >=1.030   (Normal Range: 1.003-1.035) Blood: negative   (Normal Range: Negative) pH: 5.0   (Normal Range: 5.0-8.0) Protein: negative   (Normal Range: Negative) Urobilinogen: 0.2   (Normal Range: 0-1) Nitrite: negative   (Normal Range: Negative) Leukocyte Esterace: negative   (Normal Range: Negative)    Comments: Rita Ohara  Feb 14, 2009 1:23 PM

## 2010-10-24 NOTE — Assessment & Plan Note (Signed)
Summary: cpx----pap//ccm   Vital Signs:  Patient profile:   50 year old female Menstrual status:  postmenopausal Height:      63 inches Weight:      165 pounds BMI:     29.33 Pulse rate:   66 / minute BP sitting:   120 / 80  (left arm) Cuff size:   regular  Vitals Entered By: Romualdo Bolk, CMA (AAMA) (March 19, 2010 10:01 AM) CC: CPX with pap- Pt wants to discuss going back on Brand Effexor and wants a 3 month supply for this. Pt is wanting this for hot flashes and help with mood swing.   History of Present Illness: Melody Duran comes in today  for preventive visit  Since last visit  here  there have been no major changes in health status   but   she  has been toled by coworkers that she would do better on effexor .     Was on effexor 2 years ago  for flushes and it really helped  and now   getting laid off and  has some mood issues but no hopelessness    and somehot flushes  Generic  didnt work  for her.  had hives   and  se  on the  IR  ...did badly . Needs extended release   Preventive Care Screening  Prior Values:    Pap Smear:  NEGATIVE FOR INTRAEPITHELIAL LESIONS OR MALIGNANCY. (02/14/2009)    Mammogram:  ASSESSMENT: Negative - BI-RADS 1^MM DIGITAL SCREENING (02/14/2010)    Last Tetanus Booster:  Historical (09/23/2007)   Preventive Screening-Counseling & Management  Alcohol-Tobacco     Alcohol drinks/day: <1     Alcohol type: wine     Smoking Status: quit     Year Quit: 20 years ago  Caffeine-Diet-Exercise     Caffeine use/day: 1     Does Patient Exercise: yes     Type of exercise: walking  Hep-HIV-STD-Contraception     Dental Visit-last 6 months yes     Sun Exposure-Excessive: yes   tanning beds      Sun Exposure Counseling: to decrease sun exposure  Safety-Violence-Falls     Seat Belt Use: yes     Firearms in the Home: firearms in the home     Firearm Counseling: to practice firearm safety     Smoke Detectors: yes  Current Medications  (verified): 1)  Multivitamins   Tabs (Multiple Vitamin) .Marland Kitchen.. 1 Tab By Mouth Once Daily 2)  Glucosamine .Marland Kitchen.. 1 Tab By Mouth Once Daily 3)  Vitamin D3 1000 Unit Tabs (Cholecalciferol) .... Tab By Mouth Once Daily 4)  Fish Oil   Oil (Fish Oil) .Marland Kitchen.. 1 Tab By Mouth Once Daily  Allergies (verified): 1)  ! Effexor  Past History:  Past medical, surgical, family and social histories (including risk factors) reviewed, and no changes noted (except as noted below).  Past Medical History: Current Problems:  CHEST DISCOMFORT (ICD-786.59) JOINT STIFFNESS (ICD-719.50) SHOULDER PAIN, RIGHT (ICD-719.41) PAP SMEAR, ABNORMAL (ICD-795.00) ? of MENOPAUSE, EARLY (ICD-627.2) WEIGHT GAIN (ICD-783.1) 'hx abnormal over a year ago. pap     normal x 3 and now on yearly PAPs. prob had chickenpox as a child Migraines stress echo negative  2010   Past Surgical History: Reviewed history from 11/29/2007 and no changes required. Denies surgical history  Past History:  Care Management: Cardiology: 2010  Family History: Reviewed history from 02/14/2009 and no changes required. Mgm   age 75 [place] MI  Both parents with MIs   Mom 23s  smokes  Mom breast cancer   48 Both parents  had colon cancer.   PGF dm    MI  GM had arthritis No sibs  ?RA in mom.   Family History of CAD Female 1st degree relative <60 Family History of CAD Female 1st degree relative <50 Family History of Colon CA 1st degree relative <60  Social History: Reviewed history from 02/14/2009 and no changes required. 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  Marinda Elk.   Review of Systems  The patient denies anorexia, fever, weight loss, weight gain, vision loss, decreased hearing, hoarseness, syncope, dyspnea on exertion, peripheral edema, prolonged cough, headaches,  hemoptysis, abdominal pain, melena, hematochezia, severe indigestion/heartburn, hematuria, incontinence, genital sores, muscle weakness, suspicious skin lesions, transient blindness, difficulty walking, unusual weight change, abnormal bleeding, enlarged lymph nodes, angioedema, and breast masses.         heat   intertrigo  rash    saw derm and rx with antifungal moodiness some   and hot flushes   Physical Exam  Genitalia:  Pelvic Exam:        External: normal female genitalia without lesions or masses        Vagina: normal without lesions or masses        Cervix: normal without lesions or masses        Adnexa: normal bimanual exam without masses or fullness        Uterus: normal by palpation        Pap smear: performed Physical Exam General Appearance: well developed, well nourished, no acute distress Eyes: conjunctiva and lids normal, PERRLA, EOMI, WNL Ears, Nose, Mouth, Throat: TM clear, nares clear, oral exam WNL Neck: supple, no lymphadenopathy, no thyromegaly, no JVD Respiratory: clear to auscultation and percussion, respiratory effort normal Cardiovascular: regular rate and rhythm, S1-S2, no murmur, rub or gallop, no bruits, peripheral pulses normal and symmetric, no cyanosis, clubbing, edema or varicosities Chest: no scars, masses, tenderness; no asymmetry, skin changes, nipple discharge   Gastrointestinal: soft, non-tender; no hepatosplenomegaly, masses; active bowel sounds all quadrants,  Genitourinary: no vaginal discharge, lesions; no masses or tenderness Lymphatic: no cervical, axillary or inguinal adenopathy Musculoskeletal: gait normal, muscle tone and strength WNL, no joint swelling, effusions, discoloration, crepitus  Skin: sun changes , good turgor, color WNL,  lesions, or ulcerations  mid chest with flat red spots   intertrigo. no weeping  or scaling.  Neurologic: normal mental status, normal reflexes, normal strength, sensation, and motion Psychiatric: alert; oriented to  person, place and time Other Exam:  labs reviewed .   nl     Impression & Recommendations:  Problem # 1:  PREVENTIVE HEALTH CARE (ICD-V70.0) Discussed nutrition,exercise,diet,healthy weight, vitamin D and calcium.   Problem # 2:  ROUTINE GYNECOLOGICAL EXAMINATION (ICD-V72.31)  pap done   Orders: Pap Smear, Thin Prep ( Collection of) (Z6109)  Problem # 3:  MENOPAUSE-RELATED VASOMOTOR SYMPTOMS, HOT FLASHES (ICD-627.2) plius mood    reasonable to go back on effexor  and disc  options  of dosing  .  Complete Medication List: 1)  Multivitamins Tabs (Multiple vitamin) .Marland Kitchen.. 1 tab by mouth once daily 2)  Glucosamine  .Marland Kitchen.. 1 tab by mouth once daily 3)  Vitamin D3 1000 Unit Tabs (  Cholecalciferol) .... Tab by mouth once daily 4)  Fish Oil Oil (Fish oil) .Marland Kitchen.. 1 tab by mouth once daily 5)  Effexor Xr 37.5 Mg Xr24h-cap (Venlafaxine hcl) .Marland Kitchen.. 1 by mouth once daily  increase to 2 by mouth once daily  after 1 week 6)  Effexor Xr 75 Mg Xr24h-cap (Venlafaxine hcl) .Marland Kitchen.. 1 by mouth once daily  Other Orders: Gastroenterology Referral (GI)  Patient Instructions: 1)  begin effexor  37.5 xr  and increase as directed  . Call or ROV   in 2-3 weeks or as needed then we can adjust dose accordingly . 2)  Avoid tanning beds . 3)  exercise and healthy eating  . 4)  Sent referral for colonoscopy screening Prescriptions: EFFEXOR XR 75 MG XR24H-CAP (VENLAFAXINE HCL) 1 by mouth once daily  #90 x 1   Entered and Authorized by:   Madelin Headings MD   Signed by:   Madelin Headings MD on 03/19/2010   Method used:   Print then Give to Patient   RxID:   [id number] EFFEXOR XR 37.5 MG XR24H-CAP (VENLAFAXINE HCL) 1 by mouth once daily  increase to 2 by mouth once daily  after 1 week  #60 x 1   Entered and Authorized by:   Madelin Headings MD   Signed by:   Madelin Headings MD on 03/19/2010   Method used:   Print then Give to Patient   RxID:   [id number]

## 2010-10-24 NOTE — Letter (Signed)
Summary: [facility] Instructions  [facility]  [address] [place], [place] [address]   Phone: [phone]  Fax: [phone]       Melody Duran    March 29, 1961    MRN: [id number]        Procedure Day Dorna Bloom:  Farrell Ours  04/19/10     Arrival Time:  2:30pm     Procedure Time:  3:30pm    Location of Procedure:                    _X_  [facility] (4th Floor)                       PREPARATION FOR COLONOSCOPY WITH MOVIPREP   Starting 5 days prior to your procedure  SUNDAY 07/24  do not eat nuts, seeds, popcorn, corn, beans, peas,  salads, or any raw vegetables.  Do not take any fiber supplements (e.g. Metamucil, Citrucel, and Benefiber).  THE DAY BEFORE YOUR PROCEDURE         DATE:  THURSDAY  07/28  1.  Drink clear liquids the entire day-NO SOLID FOOD  2.  Do not drink anything colored red or purple.  Avoid juices with pulp.  No orange juice.  3.  Drink at least 64 oz. (8 glasses) of fluid/clear liquids during the day to prevent dehydration and help the prep work efficiently.  CLEAR LIQUIDS INCLUDE: Water Jello Ice Popsicles Tea (sugar ok, no milk/cream) Powdered fruit flavored drinks Coffee (sugar ok, no milk/cream) Gatorade Juice: apple, white grape, white cranberry  Lemonade Clear bullion, consomm, broth Carbonated beverages (any kind) Strained chicken noodle soup Hard Candy                             4.  In the morning, mix first dose of MoviPrep solution:    Empty 1 Pouch A and 1 Pouch B into the disposable container    Add lukewarm drinking water to the top line of the container. Mix to dissolve    Refrigerate (mixed solution should be used within 24 hrs)  5.  Begin drinking the prep at 5:00 p.m. The MoviPrep container is divided by 4 marks.   Every 15 minutes drink the solution down to the next mark (approximately 8 oz) until the full liter is complete.   6.  Follow completed prep with 16 oz of clear liquid of your choice (Nothing  red or purple).  Continue to drink clear liquids until bedtime.  7.  Before going to bed, mix second dose of MoviPrep solution:    Empty 1 Pouch A and 1 Pouch B into the disposable container    Add lukewarm drinking water to the top line of the container. Mix to dissolve    Refrigerate  THE DAY OF YOUR PROCEDURE      DATE:  FRIDAY  07/29  Beginning at  10:30 a.m. (5 hours before procedure):         1. Every 15 minutes, drink the solution down to the next mark (approx 8 oz) until the full liter is complete.  2. Follow completed prep with 16 oz. of clear liquid of your choice.    3. You may drink clear liquids until 1:30pm  (2 HOURS BEFORE PROCEDURE).   MEDICATION INSTRUCTIONS  Unless otherwise instructed, you should take regular prescription medications with a small sip of water   as early as  possible the morning of your procedure.           OTHER INSTRUCTIONS  You will need a responsible adult at least 50 years of age to accompany you and drive you home.   This person must remain in the waiting room during your procedure.  Wear loose fitting clothing that is easily removed.  Leave jewelry and other valuables at home.  However, you may wish to bring a book to read or  an iPod/MP3 player to listen to music as you wait for your procedure to start.  Remove all body piercing jewelry and leave at home.  Total time from sign-in until discharge is approximately 2-3 hours.  You should go home directly after your procedure and rest.  You can resume normal activities the  day after your procedure.  The day of your procedure you should not:   Drive   Make legal decisions   Operate machinery   Drink alcohol   Return to work  You will receive specific instructions about eating, activities and medications before you leave.    The above instructions have been reviewed and explained to me by   Melody Duran, RN______________________    I fully understand and can  verbalize these instructions _____________________________ Date _________

## 2010-10-24 NOTE — Progress Notes (Signed)
Summary: refills  Phone Note Refill Request Call back at Home Phone [phone] Message from:  Melody Duran---live call  Refills Requested: Medication #1:  WELLBUTRIN XL 150 MG XR24H-TAB one by mouth daily.  Medication #2:  AMBIEN 10 MG TABS 1 by mouth at bedtime [facility]..Need 90 day supply.  Initial call taken by: Warnell Forester,  July 29, 2010 12:53 PM Caller: Melody Duran  Follow-up for Phone Call        ok to refill 90 days     x 2 wellbutrin  x 1 ambien  If doing well  then  plan check up in June 2012   . Follow-up by: Madelin Headings MD,  July 29, 2010 5:30 PM  Additional Follow-up for Phone Call Additional follow up Details #1::        Rx sent to pharmacy for wellbutrin and rx called in for [facility]  Additional Follow-up by: Romualdo Bolk, CMA (AAMA),  July 30, 2010 1:36 PM    Prescriptions: AMBIEN 10 MG TABS (ZOLPIDEM TARTRATE) 1 by mouth at bedtime  #90 x 0   Entered by:   Romualdo Bolk, CMA (AAMA)   Authorized by:   Madelin Headings MD   Signed by:   Romualdo Bolk, CMA (AAMA) on 07/30/2010   Method used:   Telephoned to ...       [facility]  [address] [phone]* (retail)       [address]       [place], [place]  [address]       Ph: [phone] or [phone]       Fax: [phone]   RxID:   [id number] WELLBUTRIN XL 150 MG XR24H-TAB (BUPROPION HCL) one by mouth daily  #90 x 1   Entered by:   Romualdo Bolk, CMA (AAMA)   Authorized by:   Madelin Headings MD   Signed by:   Romualdo Bolk, CMA (AAMA) on 07/30/2010   Method used:   Electronically to        [facility] [address] [phone]* (retail)       [address]       [place], [place]  [address]       Ph: [phone] or [phone]       Fax: [phone]   RxID:   [id number]

## 2010-10-24 NOTE — Progress Notes (Signed)
Summary: [facility] called req Wellbutrin 90 day supply  Phone Note From Pharmacy Call back at [phone] [facility]  - [place]     Caller: [facility] [address] #[phone]* Summary of Call: Pt req 90 day supply of Wellbutrin. The original script was for #30. Pls rewrite script and send for #90.  Req was faxed on Monday 05/06/10.  Initial call taken by: Lucy Antigua,  May 08, 2010 2:30 PM  Follow-up for Phone Call        Rx sent to pharmacy. Follow-up by: Romualdo Bolk, CMA Duncan Dull),  May 08, 2010 2:47 PM    Prescriptions: WELLBUTRIN XL 150 MG XR24H-TAB (BUPROPION HCL) one by mouth daily  #90 x 0   Entered by:   Romualdo Bolk, CMA (AAMA)   Authorized by:   Madelin Headings MD   Signed by:   Romualdo Bolk, CMA (AAMA) on 05/08/2010   Method used:   Electronically to        [facility] [address] [phone]* (retail)       [address]       [place], [place]  [address]       Ph: [phone] or [phone]       Fax: [phone]   RxID:   [id number]

## 2010-10-24 NOTE — Progress Notes (Signed)
Summary: [employer] referral  Phone Note Call from Patient Call back at [phone]   Caller: Patient Summary of Call: Pt called back stating that she seen another md at that office and they need a paper referral for physical therapy because of her ins. I'm going to send this message to our [facility] so she can do the Ins referral. Initial call taken by: Romualdo Bolk, CMA,  December 14, 2007 1:51 PM  Follow-up for Phone Call        [facility] to do [place] referral for physical therapy per ortho.  Follow-up by: Romualdo Bolk, CMA,  December 14, 2007 1:52 PM  Additional Follow-up for Phone Call Additional follow up Details #1::        patient called this am wants to know when she will get her referral.  Says she has been calling for over a week and expects to hear something from someone today.   ..................................................................Marland KitchenRoselle Locus  December 16, 2007 9:09 AM    Additional Follow-up for Phone Call Additional follow up Details #2::    Referral faxed Follow-up by: Florentina Addison,  December 17, 2007 9:23 AM

## 2010-10-24 NOTE — Progress Notes (Signed)
Summary: new rx nneded  Phone Note Call from Patient Call back at Work Phone [phone]   Caller: Patient---live call Summary of Call: Having hot flashes again. Would like to go back on Efferor---lowest mg. two times a day. Dr Huntley Dec rx'd in the past. Pharmacy is [facility].  Initial call taken by: Warnell Forester,  December 26, 2009 12:16 PM  Follow-up for Phone Call        Left message to call back. We have that pt is allergic to effexor. Follow-up by: Romualdo Bolk, CMA Duncan Dull),  December 26, 2009 12:45 PM  Additional Follow-up for Phone Call Additional follow up Details #1::        Spoke to pt and she was allergic to generic brand effexor. I told pt that she would need a rov to come in and discuss this. Pt states that she would wait until her cpx to discuss this.  Additional Follow-up by: Romualdo Bolk, CMA (AAMA),  December 26, 2009 2:45 PM

## 2010-10-24 NOTE — Letter (Signed)
Summary: Pre Visit No Show Letter  [facility]  [address] [place], [place] [address]   Phone: [phone]  Fax: [phone]        April 01, 2010 MRN: [id number]    [facility] [address] [place], [place]  [address]    Dear Ms. Marchant,   We have been unable to reach you by phone concerning the pre-procedure visit that you missed on 04/01/10. For this reason,your procedure scheduled on 04/15/10 has been cancelled. Our scheduling staff will gladly assist you with rescheduling your appointments at a more convenient time. Please call our office at [phone] between the hours of 8:00am and 5:00pm, press option #2 to reach an appointment scheduler. Please consider updating your contact numbers at this time so that we can reach you by phone in the future with schedule changes or results.    Thank you,    Wyona Almas RN [facility]

## 2010-10-24 NOTE — Letter (Signed)
Summary: Previsit letter  [facility]  [address] [place], [place] [address]   Phone: [phone]  Fax: [phone]       03/27/2010 MRN: [id number]  [facility] [address] [place], [place]  [address]  Dear Ms. Restivo,  Welcome to the Gastroenterology Division at [facility].    You are scheduled to see a nurse for your pre-procedure visit on 04-01-10 at 1:00p.m. on the 3rd floor at [facility], [address]  We ask that you try to arrive at our office 15 minutes prior to your appointment time to allow for check-in.  Your nurse visit will consist of discussing your medical and surgical history, your immediate family medical history, and your medications.    Please bring a complete list of all your medications or, if you prefer, bring the medication bottles and we will list them.  We will need to be aware of both prescribed and over the counter drugs.  We will need to know exact dosage information as well.  If you are on blood thinners (Coumadin, Plavix, Aggrenox, Ticlid, etc.) please call our office today/prior to your appointment, as we need to consult with your physician about holding your medication.   Please be prepared to read and sign documents such as consent forms, a financial agreement, and acknowledgement forms.  If necessary, and with your consent, a friend or relative is welcome to sit-in on the nurse visit with you.  Please bring your insurance card so that we may make a copy of it.  If your insurance requires a referral to see a specialist, please bring your referral form from your primary care physician.  No co-pay is required for this nurse visit.     If you cannot keep your appointment, please call [phone] to cancel or reschedule prior to your appointment date.  This allows [place] the opportunity to schedule an appointment for another patient in need of care.    Thank you for choosing [facility] for your medical needs.   We appreciate the opportunity to care for you.  Please visit [place] at our website  to learn more about our practice.                     Sincerely.                                                                                                                   The Gastroenterology Division

## 2010-10-24 NOTE — Progress Notes (Signed)
Summary: refill  Phone Note Refill Request Message from:  Patient on April 11, 2010 9:29 AM  Refills Requested: Medication #1:  WELLBUTRIN XL 150 MG XR24H-TAB one by mouth daily. Initial call taken by: Kern Reap CMA Duncan Dull),  April 11, 2010 9:29 AM    Prescriptions: WELLBUTRIN XL 150 MG XR24H-TAB (BUPROPION HCL) one by mouth daily  #30 x 3   Entered by:   Kern Reap CMA (AAMA)   Authorized by:   Madelin Headings MD   Signed by:   Kern Reap CMA (AAMA) on 04/11/2010   Method used:   Electronically to        [facility] [address] [phone]* (retail)       [address]       [place], [place]  [address]       Ph: [phone] or [phone]       Fax: [phone]   RxID:   [id number]

## 2010-10-24 NOTE — Progress Notes (Signed)
Summary: colonoscopy  Phone Note Call from Patient Call back at Home Phone [phone]   Summary of Call: patient would like to know if insurance will colonoscopy?  Dr Fabian Sharp was going to code the colonoscopy "different than the norm". Initial call taken by: Kern Reap CMA Duncan Dull),  April 11, 2010 1:46 PM  Follow-up for Phone Call        Spoke with pt and told her that she needs to either speak with [facility] about this or her Ins company. Because we don't have the codes or the cost at our office to know what her ins will cover. Follow-up by: Romualdo Bolk, CMA (AAMA),  April 12, 2010 10:47 AM

## 2010-10-24 NOTE — Miscellaneous (Signed)
  Clinical Lists Changes  Medications: Removed medication of EFFEXOR XR 75 MG XR24H-CAP (VENLAFAXINE HCL) 1 by mouth once daily Removed medication of EFFEXOR XR 37.5 MG XR24H-CAP (VENLAFAXINE HCL) 1 by mouth once daily  increase to 2 by mouth once daily  after 1 week Added new medication of WELLBUTRIN XL 150 MG XR24H-TAB (BUPROPION HCL) one by mouth daily - Signed Rx of WELLBUTRIN XL 150 MG XR24H-TAB (BUPROPION HCL) one by mouth daily;  #30 x 0;  Signed;  Entered by: Lynann Beaver CMA;  Authorized by: Madelin Headings MD;  Method used: Electronically to [facility] [address] [phone]*, [address], [place], [place], [place]  [address], Ph: [phone] or [phone], Fax: [phone]    Prescriptions: WELLBUTRIN XL 150 MG XR24H-TAB (BUPROPION HCL) one by mouth daily  #30 x 0   Entered by:   Lynann Beaver CMA   Authorized by:   Madelin Headings MD   Signed by:   Lynann Beaver CMA on 04/08/2010   Method used:   Electronically to        [facility] [address] [phone]* (retail)       [address]       [place], [place]  [address]       Ph: [phone] or [phone]       Fax: [phone]   RxID:   [id number]

## 2010-10-24 NOTE — Assessment & Plan Note (Signed)
Summary: NEW PT/WILL FAST/PT REQUEST A PAP/CCM   Vital Signs:  Patient Profile:   50 Years Old Female Height:     63 inches Weight:      172 pounds BMI:     30.58 Pulse rate:   78 / minute BP sitting:   120 / 80  (left arm) Cuff size:   regular  Vitals Entered By: Romualdo Bolk, CMA (November 29, 2007 8:44 AM)                 Chief Complaint:  New Pt.  History of Present Illness: Melody Duran is here to get establish and discuss aches and pains.  Pt would also like a yearly exam if possible.  Last chlolesterol check last year and was ok. Doesn't eat very healthy.  Has been  on diet pills  at time.  Phenteramine.    Per Dr Henderson Cloud.  2 years ago.  Used to do [employer] without the meetings. Not recently.   Had been on prednisone x 1 month.    Hard to lose weight Eats fast food a lot.   tries to eat healthy.   Now has right shoulder pain x 4 months   No injury except ? onset after pulling mats out of horse trailer and does hay baling.   Is r handed  .   Uses mouse a lot.  Some popping in shoulder .  Self rx ibuprofen 800   after 1 week stopped because of gi side effect .    uses heat.   Very stiff in am    .  some better but hard to lift over her head.   6 months of stiffness in am.   No particular joints Hands bad.    Very stiff after lots of activity but this is new.            Prior Medications Reviewed Using: Patient Recall  Prior Medication List:  No prior medications documented  Current Allergies (reviewed today): No known allergies   Past Medical History:    hx abnormal over a year ago. pap     normal x 3 and now on yearly PAPs.    prob had chickenpox as a child    Migraines  Past Surgical History:    Denies surgical history   Family History:    gm   age 27 Massive MI    Both parents with MIs   Mom 3s  smokes     Mom breast cancer   49    Both parents  had colon cancer.      PGF dm    MI     GM had arthritis        No sibs     ?RA in  mom.      Family History of CAD Female 1st degree relative <60    Family History of CAD Female 1st degree relative <50    Family History of Colon CA 1st degree relative <60  Social History:    Single    Former Smoker    Alcohol use-yes   rare    Drug use-no    Regular exercise-no    hh1 4 dogs and 2 horses  not allergic   Risk Factors:  Tobacco use:  quit    Year quit:  20 years ago Drug use:  no Alcohol use:  yes    Type:  wine    Comments:  socially Exercise:  no  Mammogram History:     Date of Last Mammogram:  11/21/2006    Results:  Normal Bilateral   PAP Smear History:     Date of Last PAP Smear:  11/21/2006    Results:  Normal    Review of Systems  The patient denies anorexia, fever, weight loss, chest pain, dyspnea on exhertion, peripheral edema, prolonged cough, abdominal pain, melena, hematochezia, and hematuria.         no unexplained rashes   Physical Exam  General:     alert, well-developed, well-nourished, and well-hydrated.   Head:     normocephalic and atraumatic.   Eyes:     pupils equal and pupils round.   Nose:     no external erythema and no nasal discharge.   Mouth:     pharynx pink and moist and no erythema.   Neck:     No deformities, masses, or tenderness noted.no thyromegaly.   Lungs:     normal respiratory effort, no intercostal retractions, no accessory muscle use, and normal breath sounds.   Heart:     normal rate, regular rhythm, no murmur, no gallop, and physiological split S2.   Abdomen:     Bowel sounds positive,abdomen soft and non-tender without masses, organomegaly or hernias noted. Msk:     no redness over joints.  right shoulder with pain or stiffness on elevation above neutral and pain with internal rotation   no atrophy  or weakness otherwise  Pulses:     present  without delay Extremities:     no cce  Neurologic:     non focal  Skin:     turgor normal, color normal, no rashes, no ecchymoses, and no petechiae.     Cervical Nodes:     no anterior cervical adenopathy and no posterior cervical adenopathy.   Psych:     Oriented X3, normally interactive, good eye contact, and not anxious appearing.      Impression & Recommendations:  Problem # 1:  JOINT STIFFNESS (ICD-719.50) recent onset ? erlated to weight gain  or other r/o inflammatory arthritis etc.  Problem # 2:  SHOULDER PAIN, RIGHT (ICD-719.41) tendinitis not resolving and rec ortho consult    Orders: Orthopedic Referral (Ortho)   Problem # 3:  WEIGHT GAIN (ICD-783.1) felt secondary to prednisone therapy   also could be inactivity and menopausela status  and lifestyle  discourage use of stimulant meds because of high failure rate and potential side effect   but discussed risk benefit  counseled at length  Problem # 4:  FAMILY HISTORY OF CAD FEMALE 1ST DEGREE RELATIVE <60 (ICD-V16.49) impressive hx of heart disease in both parents  Problem # 5:  FAMILY HISTORY OF COLON CA 1ST DEGREE RELATIVE <60 (ICD-V16.0) told she will need a colonoscopy at age 71 .  Other Orders: Venipuncture (16109) T-Antinuclear Antib (ANA) [phone]) UA Dipstick w/Micro (automated) (81001) TLB-Lipid Panel (80061-LIPID) TLB-BMP (Basic Metabolic Panel-BMET) (80048-METABOL) TLB-CBC Platelet - w/Differential (85025-CBCD) TLB-Hepatic/Liver Function Pnl (80076-HEPATIC) TLB-TSH (Thyroid Stimulating Hormone) (84443-TSH) TLB-T4 (Thyrox), Free [phone]) TLB-CRP-Full Range (C-Reactive Protein) (86140-FCRP) TLB-CK Total Only(Creatine Kinase/CPK) (82550-CK) TLB-Rheumatoid Factor (RA) (21308-MV) Radiology Referral (Radiology)  PAP Screening:    Last PAP smear:  11/21/2006  PAP Smear Results:    Date of Exam:  11/21/2006    Results:  Normal  Mammogram Screening:    Last Mammogram:  11/21/2006  Mammogram Results:    Date of Exam:  11/21/2006    Results:  Normal Bilateral  Osteoporosis Risk Assessment:  Risk Factors for Fracture or Low Bone Density:    Race (White or Asian):     yes   Smoking status:       quit   Patient Instructions: 1)  BMP,LFT,CBC diff,Lipid,TSH, U/A.  V70.0 2)  ANA, RF,  HSCRP,  Free T4, CPK. 715.90 3)  We will refer for your shoulder 4)  Will get our referral person to do for Mammogram   5)  schedule rov for pap and pelvic  and make slot     ] Laboratory Results   Urine Tests   Date/Time Reported: November 29, 2007 3:30 PM   Routine Urinalysis   Color: yellow Appearance: Clear Glucose: negative   (Normal Range: Negative) Bilirubin: negative   (Normal Range: Negative) Ketone: negative   (Normal Range: Negative) Spec. Gravity: >=1.030   (Normal Range: 1.003-1.035) Blood: negative   (Normal Range: Negative) pH: 5.0   (Normal Range: 5.0-8.0) Protein: trace   (Normal Range: Negative) Urobilinogen: 0.2   (Normal Range: 0-1) Nitrite: negative   (Normal Range: Negative) Leukocyte Esterace: negative   (Normal Range: Negative)    Comments: ..................................................................Marland KitchenWynona Canes, CMA  November 29, 2007 3:30 PM

## 2010-10-24 NOTE — Progress Notes (Signed)
Summary: Faxed labs as requested to Dr Penni Bombard (Ortho)  Phone Note Call from Patient Call back at [phone]   Caller: Patient Call For: Panosh Summary of Call: Pt calling to report she went to the Ortho doctor today, Dr Penni Bombard.  They did not have her lab work, so she had to schedule another OV.  Faxed labs for 3/9 to [phone] fax number. Initial call taken by: Sid Falcon LPN,  December 13, 2007 2:21 PM

## 2010-10-24 NOTE — Progress Notes (Signed)
Summary: REFILL REQUEST  Phone Note Refill Request Message from:  Patient on May 06, 2010 10:06 AM  Refills Requested: Medication #1:  WELLBUTRIN XL 150 MG XR24H-TAB one by mouth daily.   Notes: [facility] on [address]...Marland KitchenMarland Kitchen 90-day supply.    Initial call taken by: Debbra Riding,  May 06, 2010 10:07 AM  Follow-up for Phone Call        Pt aware that this was filled on 03/2010 Follow-up by: Romualdo Bolk, CMA (AAMA),  May 06, 2010 10:40 AM

## 2010-10-24 NOTE — Letter (Signed)
Summary: referral form  referral form   Imported By: Kassie Mends 01/06/2008 11:00:39  _____________________________________________________________________  External Attachment:    Type:   Image     Comment:   referral form

## 2010-10-24 NOTE — Progress Notes (Signed)
Summary: needs referal to physical therapy   Phone Note Call from Patient Call back at [phone]   Caller: patient live Call For: Beena Catano Summary of Call: would like referral to physical therapy.   Initial call taken by: Roselle Locus,  December 13, 2007 3:38 PM  Follow-up for Phone Call        Pt wants physical therapy for shoulder problems. Follow-up by: Romualdo Bolk, CMA,  December 13, 2007 3:41 PM  Additional Follow-up for Phone Call Additional follow up Details #1::        Pt has an appt with Dr. August Saucer on 3/25 12:45. Left message on machine about appt. Additional Follow-up by: Romualdo Bolk, CMA,  December 14, 2007 1:23 PM

## 2010-10-24 NOTE — Assessment & Plan Note (Signed)
Summary: NP6/CHEST DISFOMFERT/JML   CC:  pt states she is referred from dr Fabian Sharp.  History of Present Illness: 50 yo female with no prior cardiac history who I am asked to evaluate for chest pain. Approximately 2 months ago while carrying 2 pails of water the patient developed substernal chest pain. It was described as a "pressure". It did not radiate. It was not pleuritic or positional nor is it related to food. There was shortness of breath and diaphoresis but there was no nausea or vomiting. She became anxious and the pain became worse. It lasted for 10 minutes and resolved spontaneously. Since then she feels an occasional tightness in her chest when she is relaxed. However she does not have exertional chest pain. She has mild dyspnea with more extreme activities but not with routine activities. There is no orthopnea, PND, pedal edema or syncope. Because of the above we were asked to further evaluate.  Current Medications (verified): 1)  Naproxen Dr 500 Mg  Tbec (Naproxen) .... As Needed 2)  Multivitamins   Tabs (Multiple Vitamin) .Marland Kitchen.. 1 Tab By Mouth Once Daily 3)  Glucosamine .Marland Kitchen.. 1 Tab By Mouth Once Daily 4)  Vitamin D3 1000 Unit Tabs (Cholecalciferol) .... Tab By Mouth Once Daily 5)  Fish Oil   Oil (Fish Oil) .Marland Kitchen.. 1 Tab By Mouth Once Daily  Allergies: 1)  ! Effexor  Past History:  Past Medical History: Reviewed history from 03/20/2009 and no changes required. Current Problems:  CHEST DISCOMFORT (ICD-786.59) ROUTINE GYNECOLOGICAL EXAMINATION (ICD-V72.31) JOINT STIFFNESS (ICD-719.50) SHOULDER PAIN, RIGHT (ICD-719.41) PAP SMEAR, ABNORMAL (ICD-795.00) ? of MENOPAUSE, EARLY (ICD-627.2) WEIGHT GAIN (ICD-783.1) 'hx abnormal over a year ago. pap     normal x 3 and now on yearly PAPs. prob had chickenpox as a child Migraines  Past Surgical History: Reviewed history from 11/29/2007 and no changes required. Denies surgical history  Family History: Reviewed history from 02/14/2009  and no changes required. Mgm   age 6 [place] MI Both parents with MIs   Mom 81s  smokes  Mom breast cancer   72 Both parents  had colon cancer.   PGF dm    MI  GM had arthritis No sibs  ?RA in mom.   Family History of CAD Female 1st degree relative <60 Family History of CAD Female 1st degree relative <50 Family History of Colon CA 1st degree relative <60  Social History: Reviewed history from 02/14/2009 and no changes required. 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  Review of Systems       no fevers or chills, productive cough, hemoptysis, dysphasia, odynophagia, melena, hematochezia, dysuria, hematuria, rash, seizure activity, orthopnea, PND, pedal edema, claudication. Remaining systems are negative.   Vital Signs:  Patient profile:   50 year old female Menstrual status:  postmenopausal Height:      63 inches Weight:      171 pounds BMI:     30.40 Pulse rate:   70 / minute BP sitting:   124 / 62  (left arm)  Vitals Entered By: Kem Parkinson (March 21, 2009 10:44 AM)  Physical Exam  General:  Well developed/well nourished in NAD Skin warm/dry Patient not depressed No peripheral clubbing Back-normal HEENT-normal/normal eyelids Neck supple/normal carotid upstroke bilaterally; no bruits; no JVD; no thyromegaly chest - CTA/ normal expansion CV - RRR/normal S1  and S2; no murmurs, rubs or gallops; no rub; PMI nondisplaced Abdomen -NT/ND, no HSM, no mass, + bowel sounds, no bruit 2+ femoral pulses, no bruits Ext-no edema, chords, 2+ DP Neuro-grossly nonfocal     EKG  Procedure date:  02/14/2009  Findings:      Normal sinus rhythm at a rate of 67. No ST changes.  Impression & Recommendations:  Problem # 1:  CHEST DISCOMFORT (ICD-786.59) Patient's symptoms are somewhat atypical. However significant risk factors  including strong family history. Will plan to proceed with stress echocardiogram for risk stratification. If this is normal we will not pursue further cardiac workup and she will followup with her primary care physician. Orders: Stress Echo (Stress Echo)  Problem # 2:  FAMILY HISTORY OF CAD FEMALE 1ST DEGREE RELATIVE <50 (ICD-V17.3) Continue risk factor modification.  Patient Instructions: 1)  Your physician recommends that you schedule a follow-up appointment in: AS NEEDED PENDING TEST RESULTS 2)  Your physician has requested that you have a stress echocardiogram. For further information please visit [email or web address]  Please follow instruction sheet as given.

## 2010-10-24 NOTE — Progress Notes (Signed)
Summary: sleeping RX  Phone Note Call from Patient   Caller: Patient Call For: Madelin Headings MD Summary of Call: [phone] Pt is asking for sleeping pills, and wants to be called back before lunch.  Saw Dr. Fabian Sharp yesterday, and forgot to ask her. Initial call taken by: Lynann Beaver CMA,  March 20, 2010 8:54 AM  Follow-up for Phone Call        sleep aids can be dependent producing but  can do short term Ambien 10 mg 1 by mouth hs as needed sleep disp 14# Follow-up by: Madelin Headings MD,  March 20, 2010 2:13 PM  Additional Follow-up for Phone Call Additional follow up Details #1::        LM for pt to call back. Additional Follow-up by: Romualdo Bolk, CMA Duncan Dull),  March 20, 2010 2:47 PM    Additional Follow-up for Phone Call Additional follow up Details #2::    Pt aware of this and wants Rx called into [facility] [address] Rx called in.  Follow-up by: Romualdo Bolk, CMA (AAMA),  March 21, 2010 8:21 AM  New/Updated Medications: AMBIEN 10 MG TABS (ZOLPIDEM TARTRATE) 1 by mouth at bedtime Prescriptions: AMBIEN 10 MG TABS (ZOLPIDEM TARTRATE) 1 by mouth at bedtime  #14 x 0   Entered by:   Romualdo Bolk, CMA (AAMA)   Authorized by:   Madelin Headings MD   Signed by:   Romualdo Bolk, CMA (AAMA) on 03/21/2010   Method used:   Telephoned to ...       [facility]       [address]       [place], [place]  [address]       Ph: [phone]       Fax: [phone]   RxID:   [id number]

## 2010-10-24 NOTE — Progress Notes (Signed)
Summary: referral for back pt   Phone Note Call from Patient Call back at [phone]   Caller: patient vm triage Call For: Ronit Cranfield  Summary of Call: Having an awful time getting a referral from our office for physical therapy.  Got a referral for shoulder but since back was not referred they would not treat her.  She would like a call back today  Initial call taken by: Roselle Locus,  December 23, 2007 1:58 PM  Follow-up for Phone Call        Call to patient.  [employer] needs referral from Dr. Fabian Sharp, her PCP, for Dr. Markus Jarvis referral for PT for her low back pain/arthritis.  He faxed the info to Dr. Fabian Sharp today.  Please do ASAP, she's upset about how long it takes to get referrals done in this office, says if she had known this, she wouldn't have had her insurance changed.  Knows it will be tomorrow before Dr. Demetrius Charity gets message. Follow-up by: Rudy Jew, RN,  December 23, 2007 2:17 PM  Additional Follow-up for Phone Call Additional follow up Details #1::        please make sure she has the ok for pt for back also even though ortho should have been able to do this Additional Follow-up by: Madelin Headings MD,  December 28, 2007 8:59 AM    Additional Follow-up for Phone Call Additional follow up Details #2::    Left message on pt's machine to call and let us know if she has her referral for PT. Follow-up by: Lynann Beaver CMA,  December 28, 2007 9:57 AM  Additional Follow-up for Phone Call Additional follow up Details #3:: Details for Additional Follow-up Action Taken: With [facility] assistance, [place] referral form filled out for back pain sx, this form was faxed to [place], copy to [facility] (Dr Penni Bombard) and Justin Mend (Physical Therapy).  Informed pt on her work VM referral was faxed to [employer], copy to [employer] and [facility]. Additional Follow-up by: Sid Falcon LPN,  December 28, 2007 10:43 AM

## 2010-10-24 NOTE — Miscellaneous (Signed)
Summary: flu vaccine   Immunization History:  Influenza Immunization History:    Influenza:  fluvax 3+ (08/05/2010)

## 2010-10-24 NOTE — Progress Notes (Signed)
Summary: referral for Dr Penni Bombard  Phone Note Call from Patient   Caller: patient and [employer] Call For: referral Summary of Call: Monia Pouch is confused about referral.  The patient is being seen by Dr Penni Bombard, Monia Pouch has another physician listed on the referral.  I confirmed we sent the patient to Dr Penni Bombard.  [employer] needs to know how many visits we authorized, our documentation is not specific.  Monia Pouch insists they must have a number of visits.   I asked if 25 visits would be appropriate.  Both the patient and Monia Pouch said this would be fine.  I told them we would send another referral itemizing Dr Penni Bombard and 25 visits. Initial call taken by: Felipa Evener,  January 06, 2008 9:16 AM  Follow-up for Phone Call        Referral faxed [phone] Follow-up by: Florentina Addison,  January 06, 2008 9:26 AM

## 2010-10-24 NOTE — Progress Notes (Signed)
Summary: stop Effexor?  Phone Note Call from Patient   Caller: Patient Call For: Madelin Headings MD Summary of Call: Pt wants to go off of the Effexor and try a different RX.  She does not like the side effects that she has been reading about.  [phone] Initial call taken by: Lynann Beaver CMA,  March 26, 2010 1:00 PM  Follow-up for Phone Call        Are hot flushes ar mood issues more problematic . could try lexapro or celexa or zoloft in low doses also .  Follow-up by: Madelin Headings MD,  March 28, 2010 6:29 PM  Additional Follow-up for Phone Call Additional follow up Details #1::        The hot flashes are more problematic. Mood swings are much better since pt is away from work. Pt doesn't want anything that is going to give her weight gain. Additional Follow-up by: Josph Macho RMA,  March 29, 2010 9:10 AM    Additional Follow-up for Phone Call Additional follow up Details #2::    Spoke to pt and she is willing to try any of the other medications but doesn't want gain wt as a side effect. She uses [facility] Romualdo Bolk, CMA Duncan Dull)  April 03, 2010 8:37 AM   Additional Follow-up for Phone Call Additional follow up Details #3:: Details for Additional Follow-up Action Taken: there is no guarantee of  weight gain but dont see  it very often with low dose lexapro or celexa.  does she wish to try 10 mg celexa     disp 30  1 by mouth once daily   rov 1 month after starting.  LMTCB Debby [facility] CMA  April 05, 2010 8:57 AM  Pt wants to try Wellbutrin for hot flashes.   Lynann Beaver CMA  April 05, 2010 9:25 AM   Additional Follow-up by: Madelin Headings MD,  April 04, 2010 6:22 PM  wellbutin xl 150 1 by mouth once daily . disp 30    rov in 1 months   Appended Document: stop Effexor? Pt. notified.

## 2010-10-25 NOTE — Letter (Signed)
Summary: patient history  patient history   Imported By: Kassie Mends 12/07/2007 13:17:31  _____________________________________________________________________  External Attachment:    Type:   Image     Comment:   patient history

## 2011-03-18 ENCOUNTER — Other Ambulatory Visit: Payer: Self-pay

## 2011-03-25 ENCOUNTER — Ambulatory Visit: Payer: Self-pay | Admitting: Internal Medicine

## 2012-01-27 ENCOUNTER — Other Ambulatory Visit: Payer: Self-pay | Admitting: Family Medicine

## 2012-01-27 DIAGNOSIS — Z1231 Encounter for screening mammogram for malignant neoplasm of breast: Secondary | ICD-10-CM

## 2012-02-18 ENCOUNTER — Ambulatory Visit: Payer: Self-pay

## 2012-02-27 ENCOUNTER — Ambulatory Visit: Payer: Self-pay

## 2012-03-05 ENCOUNTER — Ambulatory Visit
Admission: RE | Admit: 2012-03-05 | Discharge: 2012-03-05 | Disposition: A | Discharge status: Home / Self Care | Payer: PRIVATE HEALTH INSURANCE | Source: Ambulatory Visit | Attending: Family Medicine | Admitting: Family Medicine

## 2012-03-05 DIAGNOSIS — Z1231 Encounter for screening mammogram for malignant neoplasm of breast: Secondary | ICD-10-CM

## 2013-04-05 ENCOUNTER — Other Ambulatory Visit: Payer: Self-pay | Admitting: Family Medicine

## 2013-04-05 DIAGNOSIS — Z1231 Encounter for screening mammogram for malignant neoplasm of breast: Secondary | ICD-10-CM

## 2013-05-05 ENCOUNTER — Ambulatory Visit (HOSPITAL_COMMUNITY)
Admission: RE | Admit: 2013-05-05 | Discharge: 2013-05-05 | Disposition: A | Discharge status: Home / Self Care | Payer: Self-pay | Source: Ambulatory Visit | Attending: Family Medicine | Admitting: Family Medicine

## 2013-05-05 DIAGNOSIS — Z1231 Encounter for screening mammogram for malignant neoplasm of breast: Secondary | ICD-10-CM

## 2014-04-21 ENCOUNTER — Ambulatory Visit: Payer: PRIVATE HEALTH INSURANCE | Admitting: Internal Medicine

## 2014-04-26 ENCOUNTER — Other Ambulatory Visit: Payer: Self-pay | Admitting: Obstetrics and Gynecology

## 2014-04-28 LAB — CYTOLOGY - PAP

## 2014-05-23 ENCOUNTER — Other Ambulatory Visit (HOSPITAL_COMMUNITY): Payer: Self-pay | Admitting: Obstetrics and Gynecology

## 2014-05-23 DIAGNOSIS — Z1231 Encounter for screening mammogram for malignant neoplasm of breast: Secondary | ICD-10-CM

## 2014-05-30 ENCOUNTER — Ambulatory Visit (HOSPITAL_COMMUNITY)
Admission: RE | Admit: 2014-05-30 | Discharge: 2014-05-30 | Disposition: A | Discharge status: Home / Self Care | Payer: 59 | Source: Ambulatory Visit | Attending: Obstetrics and Gynecology | Admitting: Obstetrics and Gynecology

## 2014-05-30 DIAGNOSIS — Z1231 Encounter for screening mammogram for malignant neoplasm of breast: Secondary | ICD-10-CM

## 2015-02-06 ENCOUNTER — Encounter: Payer: Self-pay | Admitting: Internal Medicine

## 2015-05-16 ENCOUNTER — Encounter: Payer: Self-pay | Admitting: Gastroenterology

## 2015-05-29 ENCOUNTER — Other Ambulatory Visit: Payer: Self-pay | Admitting: Obstetrics and Gynecology

## 2015-05-30 LAB — CYTOLOGY - PAP

## 2015-06-19 ENCOUNTER — Encounter: Payer: Self-pay | Admitting: Family Medicine

## 2015-07-11 ENCOUNTER — Other Ambulatory Visit: Payer: Self-pay

## 2015-07-11 DIAGNOSIS — Z1231 Encounter for screening mammogram for malignant neoplasm of breast: Secondary | ICD-10-CM

## 2015-07-19 ENCOUNTER — Encounter: Payer: Self-pay | Admitting: Internal Medicine

## 2015-07-27 ENCOUNTER — Ambulatory Visit
Admission: RE | Admit: 2015-07-27 | Discharge: 2015-07-27 | Disposition: A | Discharge status: Home / Self Care | Payer: 59 | Source: Ambulatory Visit

## 2015-07-27 DIAGNOSIS — Z1231 Encounter for screening mammogram for malignant neoplasm of breast: Secondary | ICD-10-CM

## 2015-09-04 ENCOUNTER — Encounter: Payer: Self-pay | Admitting: Neurology

## 2015-09-04 ENCOUNTER — Ambulatory Visit (INDEPENDENT_AMBULATORY_CARE_PROVIDER_SITE_OTHER): Payer: 59 | Admitting: Neurology

## 2015-09-04 VITALS — BP 125/72 | HR 60 | Ht 63.0 in | Wt 184.0 lb

## 2015-09-04 DIAGNOSIS — G5712 Meralgia paresthetica, left lower limb: Secondary | ICD-10-CM

## 2015-09-04 DIAGNOSIS — G5710 Meralgia paresthetica, unspecified lower limb: Secondary | ICD-10-CM | POA: Insufficient documentation

## 2015-09-04 MED ORDER — LIDOCAINE 0.5 % EX GEL: CUTANEOUS | Status: DC

## 2015-09-04 MED ORDER — GABAPENTIN 100 MG PO CAPS: 100.0000 mg | ORAL_CAPSULE | Freq: Three times a day (TID) | ORAL | Status: DC

## 2015-09-04 NOTE — Progress Notes (Signed)
PATIENT: Melody Duran DOB: 02/17/1961  Chief Complaint  Patient presents with  . Numbness    She started having left thigh numbness and burning two weeks ago. It is worse when she is laying down at night.  She has not had any testing or tried any medication for her symptoms.     HISTORICAL  Melody Duran is a 54 years old right-handed female, seen in refer by her primary care physician Dr. Cain Saupe for evaluation left thigh area numbness in September 04 2015  Around mid of November 2016, she noticed left lateral anterior thigh area numbness, burning sensation, getting worse when she rides horse, or lying flat at bed, this happened after she suffered right plantar fasciitis, walk with a limp gait for a while. She denies bowel and bladder incontinence, she denies bilateral upper extremity paresthesia or weakness.  She has mild midline low back pain.  REVIEW OF SYSTEMS: Full 14 system review of systems performed and notable only for chest pain, snoring, feeling hot, joint pain, runny nose, itching, memory loss, numbness, insomnia, sleepiness, snoring, depression anxiety.  ALLERGIES: Allergies  Allergen Reactions  . Venlafaxine     REACTION: Allergic to generic   immediate release and not brand extended release    HOME MEDICATIONS: Current Outpatient Prescriptions  Medication Sig Dispense Refill  . buPROPion (WELLBUTRIN XL) 150 MG 24 hr tablet Take 150 mg by mouth.     No current facility-administered medications for this visit.    PAST MEDICAL HISTORY: Past Medical History  Diagnosis Date  . Migraine   . Anxiety and depression   . Numbness     Left thigh    PAST SURGICAL HISTORY: Past Surgical History  Procedure Laterality Date  . No past surgeries      FAMILY HISTORY: Family History  Problem Relation Age of Onset  . Lung cancer Mother   . Heart disease Mother   . Heart disease Father   . Diabetes Paternal Grandfather   . Diabetes Maternal Grandfather   .  Kidney cancer Father     SOCIAL HISTORY:  Social History   Social History  . Marital Status: Single    Spouse Name: N/A  . Number of Children: 0  . Years of Education: 14   Occupational History  . Airline pilot    Social History Main Topics  . Smoking status: Former Games developer  . Smokeless tobacco: Not on file     Comment: Quit 1980  . Alcohol Use: No  . Drug Use: No  . Sexual Activity: Not on file   Other Topics Concern  . Not on file   Social History Narrative   Lives at home alone.   Right-handed.   5 sodas per day.     PHYSICAL EXAM   Filed Vitals:   09/04/15 0828  BP: 125/72  Pulse: 60  Height:  (1.6 m)  Weight: 184 lb (83.462 kg)    Not recorded      Body mass index is 32.6 kg/(m^2).  PHYSICAL EXAMNIATION:  Gen: NAD, conversant, well nourised, obese, well groomed                     Cardiovascular: Regular rate rhythm, no peripheral edema, warm, nontender. Eyes: Conjunctivae clear without exudates or hemorrhage Neck: Supple, no carotid bruise. Pulmonary: Clear to auscultation bilaterally   NEUROLOGICAL EXAM:  MENTAL STATUS: Speech:    Speech is normal; fluent and spontaneous with normal comprehension.  Cognition:  Orientation to time, place and person     Normal recent and remote memory     Normal Attention span and concentration     Normal Language, naming, repeating,spontaneous speech     Fund of knowledge   CRANIAL NERVES: CN II: Visual fields are full to confrontation. Fundoscopic exam is normal with sharp discs and no vascular changes. Pupils are round equal and briskly reactive to light. CN III, IV, VI: extraocular movement are normal. No ptosis. CN V: Facial sensation is intact to pinprick in all 3 divisions bilaterally. Corneal responses are intact.  CN VII: Face is symmetric with normal eye closure and smile. CN VIII: Hearing is normal to rubbing fingers CN IX, X: Palate elevates symmetrically. Phonation is normal. CN XI:  Head turning and shoulder shrug are intact CN XII: Tongue is midline with normal movements and no atrophy.  MOTOR: There is no pronator drift of out-stretched arms. Muscle bulk and tone are normal. Muscle strength is normal.  REFLEXES: Reflexes are 2+ and symmetric at the biceps, triceps, knees, and ankles. Plantar responses are flexor.  SENSORY: Intact to light touch, pinprick, position sense, and vibration sense are intact in fingers and toes.  COORDINATION: Rapid alternating movements and fine finger movements are intact. There is no dysmetria on finger-to-nose and heel-knee-shin.    GAIT/STANCE: Posture is normal. Gait is steady with normal steps, base, arm swing, and turning. Heel and toe walking are normal. Tandem gait is normal.  Romberg is absent.   DIAGNOSTIC DATA (LABS, IMAGING, TESTING) - I reviewed patient records, labs, notes, testing and imaging myself where available.   ASSESSMENT AND PLAN  Melody Duran is a 54 y.o. female   Left lateral thigh area paresthesia  Most consistent with meralgia paresthetica  Lidocaine cream  Gabapentin 100 mg 3 times a day as abortive treatment   Levert FeinsteinYijun Sanjit Mcmichael, M.D. Ph.D.  [facility] [address], [address] [place], [place] [address] Ph: [phone] Fax: [phone]  CC: Cain Saupeammie Fulp, MD

## 2015-09-07 ENCOUNTER — Ambulatory Visit (AMBULATORY_SURGERY_CENTER): Payer: Self-pay | Admitting: *Deleted

## 2015-09-07 ENCOUNTER — Telehealth: Payer: Self-pay | Admitting: Neurology

## 2015-09-07 VITALS — Ht 63.0 in | Wt 185.8 lb

## 2015-09-07 DIAGNOSIS — Z800 Family history of malignant neoplasm of digestive organs: Secondary | ICD-10-CM

## 2015-09-07 MED ORDER — NA SULFATE-K SULFATE-MG SULF 17.5-3.13-1.6 GM/177ML PO SOLN: 1.0000 | Freq: Once | ORAL | Status: DC

## 2015-09-07 NOTE — Telephone Encounter (Signed)
Tom, Pharmacist/[facility] [address] [phone] called regarding Lidocaine 0.5 % GEL, states doesn't come in this strength in a gel, has 5% ointment, please advise.

## 2015-09-07 NOTE — Telephone Encounter (Signed)
I called back.  Spoke with Melody Duran.  They had to dispense Lidocaine 5% ointment instead of gel, as gel is not available at this time.  The dose and directions remain the same.  They will call us back to advise if gel becomes available again.

## 2015-09-07 NOTE — Progress Notes (Signed)
No egg or soy allergy known to patient  No issues with past sedation with any surgeries  or procedures, no past  intubation No diet pills No home 02 use per patient   emmi video declined  Medication Samples have been provided to the patient.  Drug name: suprep  Qty: 1  LOT: [id number]  Exp.Date: 3811-18 [employer] compass will not cover this  prep so sample given   The patient has been instructed regarding the correct time, dose, and frequency of taking this medication, including desired effects and most common side effects.   Melody Duran, Melody Duran 4:37 PM 09/07/2015

## 2015-09-18 ENCOUNTER — Ambulatory Visit: Payer: 59 | Admitting: Podiatry

## 2015-09-21 ENCOUNTER — Encounter: Payer: Self-pay | Admitting: Internal Medicine

## 2015-09-21 ENCOUNTER — Ambulatory Visit (AMBULATORY_SURGERY_CENTER): Payer: 59 | Admitting: Internal Medicine

## 2015-09-21 VITALS — BP 121/69 | HR 63 | Temp 97.9°F | Resp 15 | Ht 63.0 in | Wt 185.0 lb

## 2015-09-21 DIAGNOSIS — Z800 Family history of malignant neoplasm of digestive organs: Secondary | ICD-10-CM | POA: Diagnosis not present

## 2015-09-21 DIAGNOSIS — Z1211 Encounter for screening for malignant neoplasm of colon: Secondary | ICD-10-CM

## 2015-09-21 HISTORY — PX: COLONOSCOPY: SHX174

## 2015-09-21 MED ORDER — SODIUM CHLORIDE 0.9 % IV SOLN: 500.0000 mL | INTRAVENOUS | Status: DC

## 2015-09-21 NOTE — Progress Notes (Signed)
Patient awakening,vss,report to rn 

## 2015-09-21 NOTE — Op Note (Addendum)
[facility] [address] [place], [id number]   COLONOSCOPY PROCEDURE REPORT  PATIENT: Melody LankHardy, Melody  MR#: [id number] BIRTHDATE: 1961-08-27 , 54  yrs. old GENDER: female ENDOSCOPIST: Beverley FiedlerJay M Nacole Fluhr, MD PROCEDURE DATE:  09/21/2015 PROCEDURE:   Colonoscopy, screening First Screening Colonoscopy - Avg.  risk and is 50 yrs.  old or older - No.  Prior Negative Screening - Now for repeat screening. Less than 10 yrs Prior Negative Screening - Now for repeat screening.  Above average risk  History of Adenoma - Now for follow-up colonoscopy & has been > or = to 3 yrs.  N/A  Polyps removed today? No Recommend repeat exam, <10 yrs? Yes high risk ASA CLASS:   Class II INDICATIONS:Screening for colonic neoplasia and FH Colon cancer in mother and father, last colonoscopy 2011 (Dr. Jarold MottoPatterson) MEDICATIONS: Monitored anesthesia care and Propofol 250 mg IV  DESCRIPTION OF PROCEDURE:   After the risks benefits and alternatives of the procedure were thoroughly explained, informed consent was obtained.  The digital rectal exam revealed no rectal mass.   The LB [id number]  endoscope was introduced through the anus and advanced to the cecum, which was identified by both the appendix and ileocecal valve. No adverse events experienced. The quality of the prep was good.  (Suprep was used)  The instrument was then slowly withdrawn as the colon was fully examined. Estimated blood loss is zero unless otherwise noted in this procedure report.      COLON FINDINGS: There was mild diverticulosis noted throughout the entire examined colon.   The examination was otherwise normal.  No polyps seen.  Retroflexed views revealed small internal hemorrhoids. The time to cecum = 2.5 Withdrawal time = 10.2   The scope was withdrawn and the procedure completed.  COMPLICATIONS: There were no immediate complications.  ENDOSCOPIC IMPRESSION: 1.   There was mild diverticulosis noted throughout the  entire examined colon 2.   The examination was otherwise normal  RECOMMENDATIONS: Given your significant family history of colon cancer, you should have a repeat colonoscopy in 5 years  eSigned:  Beverley FiedlerJay M Savannah Erbe, MD 09/21/2015 11:14 AM Revised: 09/21/2015 11:14 AM  cc:  the patient, PCP

## 2015-09-21 NOTE — Patient Instructions (Signed)
Discharge instructions given. Handouts on diverticulosis and a high fiber diet Resume previous medications. YOU HAD AN ENDOSCOPIC PROCEDURE TODAY AT THE [facility]:   Refer to the procedure report that was given to you for any specific questions about what was found during the examination.  If the procedure report does not answer your questions, please call your gastroenterologist to clarify.  If you requested that your care partner not be given the details of your procedure findings, then the procedure report has been included in a sealed envelope for you to review at your convenience later.  YOU SHOULD EXPECT: Some feelings of bloating in the abdomen. Passage of more gas than usual.  Walking can help get rid of the air that was put into your GI tract during the procedure and reduce the bloating. If you had a lower endoscopy (such as a colonoscopy or flexible sigmoidoscopy) you may notice spotting of blood in your stool or on the toilet paper. If you underwent a bowel prep for your procedure, you may not have a normal bowel movement for a few days.  Please Note:  You might notice some irritation and congestion in your nose or some drainage.  This is from the oxygen used during your procedure.  There is no need for concern and it should clear up in a day or so.  SYMPTOMS TO REPORT IMMEDIATELY:   Following lower endoscopy (colonoscopy or flexible sigmoidoscopy):  Excessive amounts of blood in the stool  Significant tenderness or worsening of abdominal pains  Swelling of the abdomen that is new, acute  Fever of 100F or higher   For urgent or emergent issues, a gastroenterologist can be reached at any hour by calling [phone].   DIET: Your first meal following the procedure should be a small meal and then it is ok to progress to your normal diet. Heavy or fried foods are harder to digest and may make you feel nauseous or bloated.  Likewise, meals heavy in dairy and vegetables  can increase bloating.  Drink plenty of fluids but you should avoid alcoholic beverages for 24 hours.  ACTIVITY:  You should plan to take it easy for the rest of today and you should NOT DRIVE or use heavy machinery until tomorrow (because of the sedation medicines used during the test).    FOLLOW UP: Our staff will call the number listed on your records the next business day following your procedure to check on you and address any questions or concerns that you may have regarding the information given to you following your procedure. If we do not reach you, we will leave a message.  However, if you are feeling well and you are not experiencing any problems, there is no need to return our call.  We will assume that you have returned to your regular daily activities without incident.  If any biopsies were taken you will be contacted by phone or by letter within the next 1-3 weeks.  Please call us at [phone] if you have not heard about the biopsies in 3 weeks.    SIGNATURES/CONFIDENTIALITY: You and/or your care partner have signed paperwork which will be entered into your electronic medical record.  These signatures attest to the fact that that the information above on your After Visit Summary has been reviewed and is understood.  Full responsibility of the confidentiality of this discharge information lies with you and/or your care-partner. 

## 2015-09-25 ENCOUNTER — Telehealth: Payer: Self-pay

## 2015-09-25 NOTE — Telephone Encounter (Signed)
  Follow up Call-  Call back number 09/21/2015  Post procedure Call Back phone  # [phone]  Permission to leave phone message Yes     Patient was called for follow up after her procedure on 09/21/2015. No answer at number given. A message was left on voice mail.

## 2015-10-31 ENCOUNTER — Ambulatory Visit (INDEPENDENT_AMBULATORY_CARE_PROVIDER_SITE_OTHER): Payer: BLUE CROSS/BLUE SHIELD | Admitting: Neurology

## 2015-10-31 ENCOUNTER — Encounter: Payer: Self-pay | Admitting: Neurology

## 2015-10-31 VITALS — BP 112/73 | HR 61 | Ht 63.0 in | Wt 186.0 lb

## 2015-10-31 DIAGNOSIS — G5712 Meralgia paresthetica, left lower limb: Secondary | ICD-10-CM

## 2015-10-31 MED ORDER — GABAPENTIN 300 MG PO CAPS: 300.0000 mg | ORAL_CAPSULE | Freq: Every day | ORAL | Status: DC

## 2015-10-31 NOTE — Progress Notes (Signed)
Chief Complaint  Patient presents with  . Meralgia Paresthetica    Reports the burning and tingling in her left thigh is worse.  Symptoms mostly occur at night and cause her difficutly sleeping.  She is still taking gabapentin , TID.  Lidocaine was not helpful and she stopped the medication.      PATIENT: Melody Duran DOB: [birth year]  Chief Complaint  Patient presents with  . Meralgia Paresthetica    Reports the burning and tingling in her left thigh is worse.  Symptoms mostly occur at night and cause her difficutly sleeping.  She is still taking gabapentin , TID.  Lidocaine was not helpful and she stopped the medication.     HISTORICAL  Melody Duran is a 55 years old right-handed female, seen in refer by her primary care physician Dr. Cain Saupe for evaluation left thigh area numbness in September 04 2015  Around mid of November 2016, she noticed left lateral anterior thigh area numbness, burning sensation, getting worse when she rides horse, or lying flat at bed, this happened after she suffered right plantar fasciitis, walk with a limp gait for a while. She denies bowel and bladder incontinence, she denies bilateral upper extremity paresthesia or weakness.  She has mild midline low back pain.  UPDATE Feb 8th 2017:  She complains worsening intermittent left lateral thigh paresthesia, especially at night when she lied flat, she has right plantar fasciitis, no significant gait difficulty, only mild midline low back pain, no radiating pain, no bowel and bladder incontinence, she denies left lower abdomen, left pelvic area pain. She has tried gabapentin 100 mg 3 times a day, with mild improvement  REVIEW OF SYSTEMS: Full 14 system review of systems performed and notable only for memory loss, agitation, numbness, rash, joint pain, ringing ears, runny nose   ALLERGIES: Allergies  Allergen Reactions  . Venlafaxine     REACTION: Allergic to generic   immediate release and not  brand extended release-effexor     HOME MEDICATIONS: Current Outpatient Prescriptions  Medication Sig Dispense Refill  . buPROPion (WELLBUTRIN XL) 150 MG 24 hr tablet Take 150 mg by mouth.    . cholecalciferol (VITAMIN D) 1000 UNITS tablet Take 1,000 Units by mouth daily.    . Fish Oil-Cholecalciferol (FISH OIL + D3) 1000-1000 MG-UNIT CAPS Take 2 capsules by mouth daily.    Marland Kitchen gabapentin (NEURONTIN) 100 MG capsule Take 1 capsule (100 mg total) by mouth 3 (three) times daily. 90 capsule 6  . Multiple Vitamin (MULTIVITAMIN) tablet Take 1 tablet by mouth daily.     No current facility-administered medications for this visit.    PAST MEDICAL HISTORY: Past Medical History  Diagnosis Date  . Migraine   . Anxiety and depression   . Numbness     Left thigh  . Anxiety   . Depression     PAST SURGICAL HISTORY: Past Surgical History  Procedure Laterality Date  . No past surgeries    . Colonoscopy  04-19-2010    normal-[facility]    FAMILY HISTORY: Family History  Problem Relation Age of Onset  . Lung cancer Mother   . Heart disease Mother   . Colon cancer Mother   . Stomach cancer Mother   . Heart disease Father   . Kidney cancer Father   . Colon cancer Father   . Diabetes Paternal Grandfather   . Diabetes Maternal Grandfather   . Colon polyps Neg Hx   . Esophageal cancer Neg Hx   .  Rectal cancer Neg Hx     SOCIAL HISTORY:  Social History   Social History  . Marital Status: Single    Spouse Name: N/A  . Number of Children: 0  . Years of Education: 14   Occupational History  . Airline pilot    Social History Main Topics  . Smoking status: Former Games developer  . Smokeless tobacco: Never Used     Comment: Quit 1980  . Alcohol Use: No  . Drug Use: No  . Sexual Activity: Not on file   Other Topics Concern  . Not on file   Social History Narrative   Lives at home alone.   Right-handed.   5 sodas per day.     PHYSICAL EXAM   Filed Vitals:   10/31/15 1202  BP: 112/73   Pulse: 61  Height: 5\' 3"  (1.6 m)  Weight: 186 lb (84.369 kg)    Not recorded      Body mass index is 32.96 kg/(m^2).  PHYSICAL EXAMNIATION:  Gen: NAD, conversant, well nourised, obese, well groomed                     Cardiovascular: Regular rate rhythm, no peripheral edema, warm, nontender. Eyes: Conjunctivae clear without exudates or hemorrhage Neck: Supple, no carotid bruise. Pulmonary: Clear to auscultation bilaterally   NEUROLOGICAL EXAM:  MENTAL STATUS: Speech:    Speech is normal; fluent and spontaneous with normal comprehension.  Cognition:     Orientation to time, place and person     Normal recent and remote memory     Normal Attention span and concentration     Normal Language, naming, repeating,spontaneous speech     Fund of knowledge   CRANIAL NERVES: CN II: Visual fields are full to confrontation. Fundoscopic exam is normal with sharp discs and no vascular changes. Pupils are round equal and briskly reactive to light. CN III, IV, VI: extraocular movement are normal. No ptosis. CN V: Facial sensation is intact to pinprick in all 3 divisions bilaterally. Corneal responses are intact.  CN VII: Face is symmetric with normal eye closure and smile. CN VIII: Hearing is normal to rubbing fingers CN IX, X: Palate elevates symmetrically. Phonation is normal. CN XI: Head turning and shoulder shrug are intact CN XII: Tongue is midline with normal movements and no atrophy.  MOTOR: There is no pronator drift of out-stretched arms. Muscle bulk and tone are normal. Muscle strength is normal.  REFLEXES: Reflexes are 2+ and symmetric at the biceps, triceps, knees, and ankles. Plantar responses are flexor.  SENSORY: Intact to light touch, pinprick, position sense, and vibration sense are intact in fingers and toes.With exception of mild decreased sensation at left lateral thigh  COORDINATION: Rapid alternating movements and fine finger movements are intact. There is no  dysmetria on finger-to-nose and heel-knee-shin.    GAIT/STANCE: Posture is normal. Gait is steady with normal steps, base, arm swing, and turning. Heel and toe walking are normal. Tandem gait is normal.  Romberg is absent.   DIAGNOSTIC DATA (LABS, IMAGING, TESTING) - I reviewed patient records, labs, notes, testing and imaging myself where available.   ASSESSMENT AND PLAN  Melody Duran is a 55 y.o. female   Left lateral thigh area paresthesia, Worsening left-sided numbness  Most consistent with meralgia paresthetica   increase gabapentin to 300 mg every night   There was no evidence of left lumbar radiculopathy, or left lower extremity neuropathy on examination, after discussed with patient, we decided  to hold off evaluation at this point.  Levert Feinstein, M.D. Ph.D.  [facility] [address] [place], [place] [address] Ph: [phone] Fax: [phone]  CC: Cain Saupe, MD

## 2015-11-21 ENCOUNTER — Encounter: Payer: Self-pay | Admitting: *Deleted

## 2015-11-21 ENCOUNTER — Telehealth: Payer: Self-pay | Admitting: Neurology

## 2015-11-21 MED ORDER — PREGABALIN 75 MG PO CAPS: ORAL_CAPSULE | ORAL | Status: DC

## 2015-11-21 NOTE — Telephone Encounter (Signed)
Please advise patient, stop taking gabapentin, I have written Lyrica 75 mg 2 tablets every night,

## 2015-11-21 NOTE — Telephone Encounter (Signed)
Symptoms have worsened since her appt on 10/31/15 - she was previously taking gabapentin , TID and was instructed to take  at qhs.  She does not feel this change has been helpful and would like to know if there is an alternate medications to try.

## 2015-11-21 NOTE — Telephone Encounter (Signed)
Pt called and thinks that the medication gabapentin (NEURONTIN) 300 MG capsule is making her leg worse. She said she is only taking the medication at night now and that is when her leg hurts the most. Please call and advise [phone]

## 2015-11-21 NOTE — Telephone Encounter (Signed)
Patient agreeable to change - new rx for Lyrica faxed to [facility] at [phone].

## 2015-11-21 NOTE — Addendum Note (Signed)
Addended by: Levert Feinstein on: 11/21/2015 01:42 PM   Modules accepted: Orders

## 2015-11-22 MED ORDER — DULOXETINE HCL 60 MG PO CPEP: 60.0000 mg | ORAL_CAPSULE | Freq: Every day | ORAL | Status: DC

## 2015-11-22 NOTE — Telephone Encounter (Signed)
Pt called said she picked up Lyrica but due to the side effects she doesn't want to take it. Is there anything else?

## 2015-11-22 NOTE — Telephone Encounter (Addendum)
She chose not to fill the Lyrica because one of the listed side effects is weight gain.  Feels her weight is a contributing factor to her current medication situation.  She is requesting an alternate medication.

## 2015-11-22 NOTE — Addendum Note (Signed)
Addended by: Levert Feinstein on: 11/22/2015 05:18 PM   Modules accepted: Orders, Medications

## 2016-07-02 ENCOUNTER — Other Ambulatory Visit: Payer: Self-pay | Admitting: *Deleted

## 2016-07-02 MED ORDER — DULOXETINE HCL 60 MG PO CPEP: 60.0000 mg | ORAL_CAPSULE | Freq: Every day | ORAL | 6 refills | Status: DC

## 2016-07-21 ENCOUNTER — Other Ambulatory Visit: Payer: Self-pay | Admitting: Obstetrics and Gynecology

## 2016-07-21 ENCOUNTER — Other Ambulatory Visit: Payer: Self-pay | Admitting: Family Medicine

## 2016-07-21 DIAGNOSIS — Z1231 Encounter for screening mammogram for malignant neoplasm of breast: Secondary | ICD-10-CM

## 2016-08-06 ENCOUNTER — Ambulatory Visit: Payer: 59

## 2016-08-28 ENCOUNTER — Ambulatory Visit
Admission: RE | Admit: 2016-08-28 | Discharge: 2016-08-28 | Disposition: A | Discharge status: Home / Self Care | Payer: BLUE CROSS/BLUE SHIELD | Source: Ambulatory Visit | Attending: Obstetrics and Gynecology | Admitting: Obstetrics and Gynecology

## 2016-08-28 DIAGNOSIS — Z1231 Encounter for screening mammogram for malignant neoplasm of breast: Secondary | ICD-10-CM

## 2017-01-26 ENCOUNTER — Telehealth: Payer: Self-pay | Admitting: Neurology

## 2017-01-26 MED ORDER — DULOXETINE HCL 30 MG PO CPEP: ORAL_CAPSULE | ORAL | 0 refills | Status: DC

## 2017-01-26 NOTE — Addendum Note (Signed)
Addended by: Lindell SparKIRKMAN, Dagen Beevers C on: 01/26/2017 12:22 PM   Modules accepted: Orders

## 2017-01-26 NOTE — Telephone Encounter (Signed)
Patient called office in reference to DULoxetine (CYMBALTA) 60 MG capsule. Patient is out of the medication, but would like to stop the medication and she doesn't know how to stop it and would like to know if she can get a lower dosage.  Please call

## 2017-01-26 NOTE — Telephone Encounter (Signed)
Spoke to patient - she would like to discontinue use of duloxetine 60mg , daily and tried to decrease dose to every other day but felt sick.  She is requesting a lower dose prescription to take prior to stopping the medication.  Dr. Terrace ArabiaYan has provided her with duloxetine 30mg , daily for two week then stop.  New rx sent to the pharmacy.

## 2017-01-30 ENCOUNTER — Other Ambulatory Visit: Payer: Self-pay | Admitting: Neurology

## 2017-02-24 ENCOUNTER — Telehealth: Payer: Self-pay | Admitting: Neurology

## 2017-02-24 NOTE — Telephone Encounter (Signed)
Patient calling to discuss discontinuing DULoxetine (CYMBALTA) 30 MG capsule.

## 2017-02-24 NOTE — Telephone Encounter (Signed)
Dr. Terrace ArabiaYan last saw this patient in February 2017.  States that her neurological symptoms have resolved.  She was taking duloxetine 60mg , daily and called stating she wanted to discontinue the medication.  Dr. Terrace ArabiaYan provided her a prescription for duloxetine 30mg , daily with the instructions to take it for two weeks then stop.  The patient called today and says she had stopped the medication but after four days feels bad and restarts it.  This has been an ongoing cycle.  I explained to her this will not be beneficial and starting/stopping can cause her to feel bad.  I offered to make her an appt but she declined due to the absence of neurological problems.  Therefore, I advised her to follow up with a primary care physician.  States she has already seen another MD and they had a different view on how to titrate off the medication.  I encouraged her to follow up with that physician for assistance in stopping this medication.

## 2017-08-05 ENCOUNTER — Other Ambulatory Visit: Payer: Self-pay | Admitting: Obstetrics and Gynecology

## 2017-08-05 DIAGNOSIS — Z139 Encounter for screening, unspecified: Secondary | ICD-10-CM

## 2017-09-02 ENCOUNTER — Ambulatory Visit
Admission: RE | Admit: 2017-09-02 | Discharge: 2017-09-02 | Disposition: A | Discharge status: Home / Self Care | Payer: BLUE CROSS/BLUE SHIELD | Source: Ambulatory Visit | Attending: Obstetrics and Gynecology | Admitting: Obstetrics and Gynecology

## 2017-09-02 DIAGNOSIS — Z139 Encounter for screening, unspecified: Secondary | ICD-10-CM

## 2017-11-18 NOTE — Telephone Encounter (Signed)
Error

## 2018-08-04 ENCOUNTER — Other Ambulatory Visit: Payer: Self-pay | Admitting: Obstetrics and Gynecology

## 2018-08-04 DIAGNOSIS — Z1231 Encounter for screening mammogram for malignant neoplasm of breast: Secondary | ICD-10-CM

## 2018-09-17 ENCOUNTER — Ambulatory Visit
Admission: RE | Admit: 2018-09-17 | Discharge: 2018-09-17 | Disposition: A | Discharge status: Home / Self Care | Payer: BLUE CROSS/BLUE SHIELD | Source: Ambulatory Visit | Attending: Obstetrics and Gynecology | Admitting: Obstetrics and Gynecology

## 2018-09-17 DIAGNOSIS — Z1231 Encounter for screening mammogram for malignant neoplasm of breast: Secondary | ICD-10-CM

## 2019-10-17 ENCOUNTER — Other Ambulatory Visit: Payer: Self-pay | Admitting: Obstetrics and Gynecology

## 2019-10-17 ENCOUNTER — Other Ambulatory Visit: Payer: Self-pay | Admitting: Internal Medicine

## 2019-10-17 DIAGNOSIS — Z1231 Encounter for screening mammogram for malignant neoplasm of breast: Secondary | ICD-10-CM

## 2019-11-23 ENCOUNTER — Ambulatory Visit
Admission: RE | Admit: 2019-11-23 | Discharge: 2019-11-23 | Disposition: A | Discharge status: Home / Self Care | Payer: 59 | Source: Ambulatory Visit | Attending: Internal Medicine | Admitting: Internal Medicine

## 2019-11-23 ENCOUNTER — Other Ambulatory Visit: Payer: Self-pay

## 2019-11-23 ENCOUNTER — Other Ambulatory Visit: Payer: Self-pay | Admitting: Family Medicine

## 2019-11-23 DIAGNOSIS — Z1231 Encounter for screening mammogram for malignant neoplasm of breast: Secondary | ICD-10-CM

## 2020-11-27 ENCOUNTER — Other Ambulatory Visit: Payer: Self-pay | Admitting: Family Medicine

## 2020-11-27 DIAGNOSIS — Z1231 Encounter for screening mammogram for malignant neoplasm of breast: Secondary | ICD-10-CM

## 2020-12-04 ENCOUNTER — Encounter: Payer: Self-pay | Admitting: Internal Medicine

## 2020-12-10 ENCOUNTER — Encounter: Payer: Self-pay | Admitting: Internal Medicine

## 2021-01-18 ENCOUNTER — Other Ambulatory Visit: Payer: Self-pay

## 2021-01-18 ENCOUNTER — Ambulatory Visit
Admission: RE | Admit: 2021-01-18 | Discharge: 2021-01-18 | Disposition: A | Discharge status: Home / Self Care | Payer: 59 | Source: Ambulatory Visit | Attending: Family Medicine | Admitting: Family Medicine

## 2021-01-18 DIAGNOSIS — Z1231 Encounter for screening mammogram for malignant neoplasm of breast: Secondary | ICD-10-CM

## 2021-02-04 ENCOUNTER — Other Ambulatory Visit: Payer: Self-pay

## 2021-02-04 ENCOUNTER — Ambulatory Visit (AMBULATORY_SURGERY_CENTER): Payer: Self-pay | Admitting: *Deleted

## 2021-02-04 VITALS — Ht 63.0 in | Wt 195.0 lb

## 2021-02-04 DIAGNOSIS — Z800 Family history of malignant neoplasm of digestive organs: Secondary | ICD-10-CM

## 2021-02-04 MED ORDER — NA SULFATE-K SULFATE-MG SULF 17.5-3.13-1.6 GM/177ML PO SOLN: ORAL | 0 refills | Status: DC

## 2021-02-04 NOTE — Progress Notes (Signed)
Patient is here in-person for PV. Patient denies any allergies to eggs or soy. Patient denies any problems with anesthesia/sedation. Patient denies any oxygen use at home. Patient denies taking any diet/weight loss medications or blood thinners. Patient is not being treated for MRSA or C-diff. Patient is aware of our care-partner policy and Covid-19 safety protocol.   Patient is  COVID-19 vaccinated, per patient.   

## 2021-02-20 ENCOUNTER — Ambulatory Visit (AMBULATORY_SURGERY_CENTER): Payer: 59 | Admitting: Internal Medicine

## 2021-02-20 ENCOUNTER — Other Ambulatory Visit: Payer: Self-pay

## 2021-02-20 ENCOUNTER — Encounter: Payer: Self-pay | Admitting: Internal Medicine

## 2021-02-20 VITALS — BP 129/75 | HR 58 | Temp 97.0°F | Resp 15 | Ht 63.0 in | Wt 195.0 lb

## 2021-02-20 DIAGNOSIS — Z1211 Encounter for screening for malignant neoplasm of colon: Secondary | ICD-10-CM | POA: Diagnosis not present

## 2021-02-20 DIAGNOSIS — Z800 Family history of malignant neoplasm of digestive organs: Secondary | ICD-10-CM | POA: Diagnosis present

## 2021-02-20 HISTORY — PX: COLONOSCOPY: SHX174

## 2021-02-20 MED ORDER — SODIUM CHLORIDE 0.9 % IV SOLN: 500.0000 mL | Freq: Once | INTRAVENOUS | Status: DC

## 2021-02-20 NOTE — Patient Instructions (Signed)
Please read handouts provided. Continue present medications. Repeat colonoscopy in 5 years for screening purposes.    YOU HAD AN ENDOSCOPIC PROCEDURE TODAY AT THE [facility]:   Refer to the procedure report that was given to you for any specific questions about what was found during the examination.  If the procedure report does not answer your questions, please call your gastroenterologist to clarify.  If you requested that your care partner not be given the details of your procedure findings, then the procedure report has been included in a sealed envelope for you to review at your convenience later.  YOU SHOULD EXPECT: Some feelings of bloating in the abdomen. Passage of more gas than usual.  Walking can help get rid of the air that was put into your GI tract during the procedure and reduce the bloating. If you had a lower endoscopy (such as a colonoscopy or flexible sigmoidoscopy) you may notice spotting of blood in your stool or on the toilet paper. If you underwent a bowel prep for your procedure, you may not have a normal bowel movement for a few days.  Please Note:  You might notice some irritation and congestion in your nose or some drainage.  This is from the oxygen used during your procedure.  There is no need for concern and it should clear up in a day or so.  SYMPTOMS TO REPORT IMMEDIATELY:   Following lower endoscopy (colonoscopy or flexible sigmoidoscopy):  Excessive amounts of blood in the stool  Significant tenderness or worsening of abdominal pains  Swelling of the abdomen that is new, acute  Fever of 100F or higher   For urgent or emergent issues, a gastroenterologist can be reached at any hour by calling [phone]. Do not use MyChart messaging for urgent concerns.    DIET:  We do recommend a small meal at first, but then you may proceed to your regular diet.  Drink plenty of fluids but you should avoid alcoholic beverages for 24 hours.  ACTIVITY:   You should plan to take it easy for the rest of today and you should NOT DRIVE or use heavy machinery until tomorrow (because of the sedation medicines used during the test).    FOLLOW UP: Our staff will call the number listed on your records 48-72 hours following your procedure to check on you and address any questions or concerns that you may have regarding the information given to you following your procedure. If we do not reach you, we will leave a message.  We will attempt to reach you two times.  During this call, we will ask if you have developed any symptoms of COVID 19. If you develop any symptoms (ie: fever, flu-like symptoms, shortness of breath, cough etc.) before then, please call [phone].  If you test positive for Covid 19 in the 2 weeks post procedure, please call and report this information to us.    If any biopsies were taken you will be contacted by phone or by letter within the next 1-3 weeks.  Please call us at [phone] if you have not heard about the biopsies in 3 weeks.    SIGNATURES/CONFIDENTIALITY: You and/or your care partner have signed paperwork which will be entered into your electronic medical record.  These signatures attest to the fact that that the information above on your After Visit Summary has been reviewed and is understood.  Full responsibility of the confidentiality of this discharge information lies with you and/or your care-partner. 

## 2021-02-20 NOTE — Op Note (Signed)
[facility] Patient Name: Melody Duran Procedure Date: 02/20/2021 2:27 PM MRN: [id number] Endoscopist: Beverley Fiedler , MD Age: 60 Referring MD:  Date of Birth: 06/17/1961 Gender: Female Account #: [id number] Procedure:                Colonoscopy Indications:              Colon cancer screening in patient at increased                            risk: Colorectal cancer in mother and father; Last                            colonoscopy: December 2016 Medicines:                Monitored Anesthesia Care Procedure:                Pre-Anesthesia Assessment:                           - Prior to the procedure, a History and Physical                            was performed, and patient medications and                            allergies were reviewed. The patient's tolerance of                            previous anesthesia was also reviewed. The risks                            and benefits of the procedure and the sedation                            options and risks were discussed with the patient.                            All questions were answered, and informed consent                            was obtained. Prior Anticoagulants: The patient has                            taken no previous anticoagulant or antiplatelet                            agents. ASA Grade Assessment: II - A patient with                            mild systemic disease. After reviewing the risks                            and benefits, the patient was deemed in  satisfactory condition to undergo the procedure.                           After obtaining informed consent, the colonoscope                            was passed under direct vision. Throughout the                            procedure, the patient's blood pressure, pulse, and                            oxygen saturations were monitored continuously. The                            Olympus PCF-H190DL (#[id number])  Colonoscope was                            introduced through the anus and advanced to the                            terminal ileum. The colonoscopy was performed                            without difficulty. The patient tolerated the                            procedure well. The quality of the bowel                            preparation was good. The terminal ileum, ileocecal                            valve, appendiceal orifice, and rectum were                            photographed. Scope In: 2:44:38 PM Scope Out: 2:57:07 PM Scope Withdrawal Time: 0 hours 8 minutes 41 seconds  Total Procedure Duration: 0 hours 12 minutes 29 seconds  Findings:                 The digital rectal exam was normal.                           The terminal ileum appeared normal.                           Multiple small and large-mouthed diverticula were                            found in the sigmoid colon, proximal transverse                            colon, hepatic flexure and ascending colon.  The exam was otherwise without abnormality on                            direct and retroflexion views. Complications:            No immediate complications. Estimated Blood Loss:     Estimated blood loss: none. Impression:               - The examined portion of the ileum was normal.                           - Diverticulosis in the sigmoid colon, in the                            proximal transverse colon, at the hepatic flexure                            and in the ascending colon.                           - The examination was otherwise normal on direct                            and retroflexion views.                           - No specimens collected. Recommendation:           - Patient has a contact number available for                            emergencies. The signs and symptoms of potential                            delayed complications were discussed with the                             patient. Return to normal activities tomorrow.                            Written discharge instructions were provided to the                            patient.                           - Resume previous diet.                           - Continue present medications.                           - Repeat colonoscopy in 5 years for screening                            purposes. Beverley Fiedler, MD 02/20/2021 3:11:41 PM This report has been signed electronically.

## 2021-02-20 NOTE — Progress Notes (Signed)
Pt's states no medical or surgical changes since previsit or office visit.  Vitals [place] 

## 2021-02-20 NOTE — Progress Notes (Signed)
To pacu, VSS. Report to [email or web address] 

## 2021-02-22 ENCOUNTER — Telehealth: Payer: Self-pay

## 2021-02-22 NOTE — Telephone Encounter (Signed)
  Follow up Call-  Call back number 02/20/2021  Post procedure Call Back phone  # [phone]  Permission to leave phone message Yes  Some recent data might be hidden     Patient questions:  Do you have a fever, pain , or abdominal swelling? No. Pain Score  0 *  Have you tolerated food without any problems? Yes.    Have you been able to return to your normal activities? Yes.    Do you have any questions about your discharge instructions: Diet   No. Medications  No. Follow up visit  No.  Do you have questions or concerns about your Care? No.  Actions: * If pain score is 4 or above: No action needed, pain <4.  1. Have you developed a fever since your procedure? No   2.   Have you had an respiratory symptoms (SOB or cough) since your procedure? No   3.   Have you tested positive for COVID 19 since your procedure no   4.   Have you had any family members/close contacts diagnosed with the COVID 19 since your procedure?  No    If yes to any of these questions please route to Laverna Peace, RN and Karlton Lemon, RN

## 2021-12-17 ENCOUNTER — Other Ambulatory Visit: Payer: Self-pay

## 2021-12-17 DIAGNOSIS — Z1231 Encounter for screening mammogram for malignant neoplasm of breast: Secondary | ICD-10-CM

## 2022-01-07 ENCOUNTER — Other Ambulatory Visit: Payer: Self-pay | Admitting: Family Medicine

## 2022-01-07 DIAGNOSIS — Z1231 Encounter for screening mammogram for malignant neoplasm of breast: Secondary | ICD-10-CM

## 2022-01-20 ENCOUNTER — Other Ambulatory Visit: Payer: Self-pay

## 2022-01-20 DIAGNOSIS — Z1231 Encounter for screening mammogram for malignant neoplasm of breast: Secondary | ICD-10-CM

## 2022-02-27 ENCOUNTER — Ambulatory Visit (INDEPENDENT_AMBULATORY_CARE_PROVIDER_SITE_OTHER): Payer: 59

## 2022-02-27 DIAGNOSIS — Z1231 Encounter for screening mammogram for malignant neoplasm of breast: Secondary | ICD-10-CM | POA: Diagnosis not present

## 2022-05-20 DIAGNOSIS — M6530 Trigger finger, unspecified finger: Secondary | ICD-10-CM | POA: Diagnosis not present

## 2022-05-20 DIAGNOSIS — S0121XD Laceration without foreign body of nose, subsequent encounter: Secondary | ICD-10-CM | POA: Diagnosis not present

## 2022-05-20 DIAGNOSIS — M19049 Primary osteoarthritis, unspecified hand: Secondary | ICD-10-CM | POA: Diagnosis not present

## 2022-05-21 DIAGNOSIS — M65331 Trigger finger, right middle finger: Secondary | ICD-10-CM | POA: Diagnosis not present

## 2022-05-21 DIAGNOSIS — M1812 Unilateral primary osteoarthritis of first carpometacarpal joint, left hand: Secondary | ICD-10-CM | POA: Diagnosis not present

## 2022-08-04 DIAGNOSIS — H04123 Dry eye syndrome of bilateral lacrimal glands: Secondary | ICD-10-CM | POA: Diagnosis not present

## 2022-08-04 DIAGNOSIS — H25813 Combined forms of age-related cataract, bilateral: Secondary | ICD-10-CM | POA: Diagnosis not present

## 2022-08-04 DIAGNOSIS — H43812 Vitreous degeneration, left eye: Secondary | ICD-10-CM | POA: Diagnosis not present

## 2022-09-18 DIAGNOSIS — L821 Other seborrheic keratosis: Secondary | ICD-10-CM | POA: Diagnosis not present

## 2022-09-18 DIAGNOSIS — D2272 Melanocytic nevi of left lower limb, including hip: Secondary | ICD-10-CM | POA: Diagnosis not present

## 2022-09-18 DIAGNOSIS — D225 Melanocytic nevi of trunk: Secondary | ICD-10-CM | POA: Diagnosis not present

## 2022-09-18 DIAGNOSIS — C44311 Basal cell carcinoma of skin of nose: Secondary | ICD-10-CM | POA: Diagnosis not present

## 2022-09-18 DIAGNOSIS — L720 Epidermal cyst: Secondary | ICD-10-CM | POA: Diagnosis not present

## 2022-09-18 DIAGNOSIS — L814 Other melanin hyperpigmentation: Secondary | ICD-10-CM | POA: Diagnosis not present

## 2022-09-18 DIAGNOSIS — Z85828 Personal history of other malignant neoplasm of skin: Secondary | ICD-10-CM | POA: Diagnosis not present

## 2022-09-18 DIAGNOSIS — D485 Neoplasm of uncertain behavior of skin: Secondary | ICD-10-CM | POA: Diagnosis not present

## 2022-09-18 DIAGNOSIS — L570 Actinic keratosis: Secondary | ICD-10-CM | POA: Diagnosis not present

## 2022-09-18 DIAGNOSIS — L578 Other skin changes due to chronic exposure to nonionizing radiation: Secondary | ICD-10-CM | POA: Diagnosis not present

## 2022-10-10 DIAGNOSIS — L988 Other specified disorders of the skin and subcutaneous tissue: Secondary | ICD-10-CM | POA: Diagnosis not present

## 2022-10-10 DIAGNOSIS — D485 Neoplasm of uncertain behavior of skin: Secondary | ICD-10-CM | POA: Diagnosis not present

## 2022-10-13 DIAGNOSIS — C44311 Basal cell carcinoma of skin of nose: Secondary | ICD-10-CM | POA: Diagnosis not present

## 2022-10-23 DIAGNOSIS — H43811 Vitreous degeneration, right eye: Secondary | ICD-10-CM | POA: Diagnosis not present

## 2022-10-23 DIAGNOSIS — H43812 Vitreous degeneration, left eye: Secondary | ICD-10-CM | POA: Diagnosis not present

## 2022-12-08 DIAGNOSIS — R635 Abnormal weight gain: Secondary | ICD-10-CM | POA: Diagnosis not present

## 2023-01-19 DIAGNOSIS — L578 Other skin changes due to chronic exposure to nonionizing radiation: Secondary | ICD-10-CM | POA: Diagnosis not present

## 2023-01-19 DIAGNOSIS — L304 Erythema intertrigo: Secondary | ICD-10-CM | POA: Diagnosis not present

## 2023-01-19 DIAGNOSIS — L918 Other hypertrophic disorders of the skin: Secondary | ICD-10-CM | POA: Diagnosis not present

## 2023-01-19 DIAGNOSIS — L570 Actinic keratosis: Secondary | ICD-10-CM | POA: Diagnosis not present

## 2023-01-19 DIAGNOSIS — Z85828 Personal history of other malignant neoplasm of skin: Secondary | ICD-10-CM | POA: Diagnosis not present

## 2023-01-21 DIAGNOSIS — N952 Postmenopausal atrophic vaginitis: Secondary | ICD-10-CM | POA: Diagnosis not present

## 2023-03-03 DIAGNOSIS — Z01419 Encounter for gynecological examination (general) (routine) without abnormal findings: Secondary | ICD-10-CM | POA: Diagnosis not present

## 2023-03-03 DIAGNOSIS — Z6834 Body mass index (BMI) 34.0-34.9, adult: Secondary | ICD-10-CM | POA: Diagnosis not present

## 2023-03-03 DIAGNOSIS — N952 Postmenopausal atrophic vaginitis: Secondary | ICD-10-CM | POA: Diagnosis not present

## 2023-03-27 DIAGNOSIS — R7303 Prediabetes: Secondary | ICD-10-CM | POA: Diagnosis not present

## 2023-03-27 DIAGNOSIS — E785 Hyperlipidemia, unspecified: Secondary | ICD-10-CM | POA: Diagnosis not present

## 2023-03-27 DIAGNOSIS — R946 Abnormal results of thyroid function studies: Secondary | ICD-10-CM | POA: Diagnosis not present

## 2023-03-27 DIAGNOSIS — R7989 Other specified abnormal findings of blood chemistry: Secondary | ICD-10-CM | POA: Diagnosis not present

## 2023-03-30 DIAGNOSIS — Z1231 Encounter for screening mammogram for malignant neoplasm of breast: Secondary | ICD-10-CM | POA: Diagnosis not present

## 2023-04-01 DIAGNOSIS — Z0000 Encounter for general adult medical examination without abnormal findings: Secondary | ICD-10-CM | POA: Diagnosis not present

## 2023-04-01 DIAGNOSIS — E038 Other specified hypothyroidism: Secondary | ICD-10-CM | POA: Diagnosis not present

## 2023-04-01 DIAGNOSIS — Z800 Family history of malignant neoplasm of digestive organs: Secondary | ICD-10-CM | POA: Diagnosis not present

## 2023-04-01 DIAGNOSIS — E669 Obesity, unspecified: Secondary | ICD-10-CM | POA: Diagnosis not present

## 2023-04-01 DIAGNOSIS — R7303 Prediabetes: Secondary | ICD-10-CM | POA: Diagnosis not present

## 2023-08-19 DIAGNOSIS — H25813 Combined forms of age-related cataract, bilateral: Secondary | ICD-10-CM | POA: Diagnosis not present

## 2023-08-19 DIAGNOSIS — H04123 Dry eye syndrome of bilateral lacrimal glands: Secondary | ICD-10-CM | POA: Diagnosis not present

## 2023-08-19 DIAGNOSIS — H43813 Vitreous degeneration, bilateral: Secondary | ICD-10-CM | POA: Diagnosis not present

## 2023-08-26 ENCOUNTER — Ambulatory Visit: Payer: 59 | Admitting: Orthopedic Surgery

## 2023-08-26 ENCOUNTER — Other Ambulatory Visit (INDEPENDENT_AMBULATORY_CARE_PROVIDER_SITE_OTHER): Payer: 59

## 2023-08-26 DIAGNOSIS — M79671 Pain in right foot: Secondary | ICD-10-CM

## 2023-08-28 ENCOUNTER — Encounter: Payer: Self-pay | Admitting: Orthopedic Surgery

## 2023-08-28 NOTE — Progress Notes (Signed)
Office Visit Note   Patient: Melody Duran           Date of Birth: 07/21/1961           MRN: [id number] Visit Date: 08/26/2023 Requested by: Sigmund Hazel, MD [address] [place],  [place] [address] PCP: Sigmund Hazel, MD  Subjective: Chief Complaint  Patient presents with   Right Foot - Pain    HPI: Melody Duran is a 62 y.o. female who presents to the office reporting right foot pain of 2 months duration.  Denies any history of injury.  Pain comes and goes.  Actually had to stop walking due to her foot symptoms.  When she walks barefoot is actually okay.  She does report limping at times.  No history of decreased vitamin D.  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  Patient is not a smoker with no history of diabetes..                ROS: All systems reviewed are negative as they relate to the chief complaint within the history of present illness.  Patient denies fevers or chills.  Assessment & Plan: Visit Diagnoses:  1. Pain in right foot     Plan: Impression is focal foot pain at the neck of metatarsal #2 and 3.  Could be early stress reaction.  Is definitely activity related.  Radiographs unremarkable today.  Due to duration of symptoms MRI scan indicated to evaluate for stress fracture which would change our treatment plan.  Follow-up after that study.  Follow-Up Instructions: No follow-ups on file.   Orders:  Orders Placed This Encounter  Procedures   XR Foot Complete Right   MR Foot Right w/o contrast   No orders of the defined types were placed in this encounter.     Procedures: No procedures performed   Clinical Data: No additional findings.  Objective: Vital Signs: There were no vitals taken for this visit.  Physical Exam:  Constitutional: Patient appears well-developed HEENT:  Head: Normocephalic Eyes:EOM are normal Neck: Normal range of motion Cardiovascular: Normal rate Pulmonary/chest: Effort normal Neurologic:  Patient is alert Skin: Skin is warm Psychiatric: Patient has normal mood and affect  Ortho Exam: Ortho exam demonstrates tenderness to palpation around metatarsal head #2 and 3.  Pedal pulses palpable.  Palpable intact nontender anterior to posterior to peroneal and Achilles tendons.  Negative Mulder's click.  No paresthesias in any of the lesser toes.  Tibiotalar subtalar transverse tarsal range of motion intact and symmetric with the left ankle.  The toes 2 and 3 do have more swelling particularly the second toe compared to the left foot toes 2 and 3.  Specialty Comments:  No specialty comments available.  Imaging: No results found.   PMFS History: Patient Active Problem List   Diagnosis Date Noted   Meralgia paraesthetica 09/04/2015   MENOPAUSE-RELATED VASOMOTOR SYMPTOMS, HOT FLASHES 03/19/2010   CHEST DISCOMFORT 02/14/2009   SHOULDER PAIN, RIGHT 11/29/2007   JOINT STIFFNESS 11/29/2007   WEIGHT GAIN 11/29/2007   PAP SMEAR, ABNORMAL 11/29/2007   Past Medical History:  Diagnosis Date   Allergy    Anxiety    Anxiety and depression    Arthritis    Depression    Migraine    Numbness    Left thigh    Family History  Problem Relation Age of Onset   Lung cancer Mother    Heart disease Mother    Colon  cancer Mother        ?   Stomach cancer Mother    Breast cancer Mother        after 78's   Heart disease Father    Kidney cancer Father    Colon cancer Father        ?   Diabetes Paternal Grandfather    Diabetes Maternal Grandfather    Colon polyps Neg Hx    Esophageal cancer Neg Hx    Rectal cancer Neg Hx     Past Surgical History:  Procedure Laterality Date   COLONOSCOPY  04-19-2010   normal-[facility]   COLONOSCOPY  09/21/2015   normal   COLONOSCOPY  02/20/2021   NO PAST SURGERIES     WISDOM TOOTH EXTRACTION     x4   Social History   Occupational History   Occupation: Airline pilot  Tobacco Use   Smoking status: Former   Smokeless tobacco: Never   Tobacco  comments:    Quit 1980  Vaping Use   Vaping status: Never Used  Substance and Sexual Activity   Alcohol use: No    Alcohol/week: 0.0 standard drinks of alcohol   Drug use: No   Sexual activity: Not on file

## 2023-09-04 DIAGNOSIS — H25812 Combined forms of age-related cataract, left eye: Secondary | ICD-10-CM | POA: Diagnosis not present

## 2023-09-07 ENCOUNTER — Ambulatory Visit: Payer: 59 | Admitting: Physician Assistant

## 2023-09-08 ENCOUNTER — Ambulatory Visit: Payer: 59 | Admitting: Family

## 2023-09-09 ENCOUNTER — Ambulatory Visit: Payer: 59 | Admitting: Physician Assistant

## 2023-09-09 ENCOUNTER — Encounter: Payer: Self-pay | Admitting: Physician Assistant

## 2023-09-09 ENCOUNTER — Other Ambulatory Visit (INDEPENDENT_AMBULATORY_CARE_PROVIDER_SITE_OTHER): Payer: Self-pay

## 2023-09-09 DIAGNOSIS — M79605 Pain in left leg: Secondary | ICD-10-CM | POA: Diagnosis not present

## 2023-09-09 DIAGNOSIS — M5440 Lumbago with sciatica, unspecified side: Secondary | ICD-10-CM | POA: Diagnosis not present

## 2023-09-09 DIAGNOSIS — M545 Low back pain, unspecified: Secondary | ICD-10-CM | POA: Insufficient documentation

## 2023-09-09 NOTE — Progress Notes (Unsigned)
Office Visit Note   Patient: Melody Duran           Date of Birth: 03-11-1961           MRN: [id number] Visit Date: 09/09/2023              Requested by: Sigmund Hazel, MD [address] [place],  [place] [address] PCP: Sigmund Hazel, MD  No chief complaint on file.     HPI: Patient is a pleasant 62 year old woman with a chief complaint of left leg pain.  She denies any knee or groin pain.  She said this has been going on for several years but worse the last 3 months.  She says it hurts sleeping or sitting straight up or driving.  Points to the front of leg and lateral.  Describes pins-and-needles.  Sometimes a quick stabbing pain.  On the back of her leg is the worst.  She has been told in the past that she might have arthritis in her low back.  She has had PT in the past but does not remember specifically what this is for  Assessment & Plan: Visit Diagnoses: No diagnosis found.  Plan: ***  Follow-Up Instructions: No follow-ups on file.   Ortho Exam  Patient is alert, oriented, no adenopathy, well-dressed, normal affect, normal respiratory effort. ***  Imaging: No results found. No images are attached to the encounter.  Labs: No results found for: "HGBA1C", "ESRSEDRATE", "CRP", "LABURIC", "REPTSTATUS", "GRAMSTAIN", "CULT", "LABORGA"   Lab Results  Component Value Date   ALBUMIN 4.0 03/13/2010   ALBUMIN 4.1 02/14/2009   ALBUMIN 4.1 11/29/2007    No results found for: "MG" No results found for: "VD25OH"  No results found for: "PREALBUMIN"    Latest Ref Rng & Units 03/13/2010    9:08 AM 02/14/2009   12:00 AM 11/29/2007   10:09 AM  CBC EXTENDED  WBC 4.5 - 10.5 10*3/microliter 6.3  6.5  6.1   RBC 3.87 - 5.11 M/uL 4.03  4.10  4.52   Hemoglobin 12.0 - 15.0 g/dL 29.5  28.4  13.2   HCT 36.0 - 46.0 % 38.3  38.6  41.3   Platelets 150.0 - 400.0 K/uL 258.0  240.0  290   NEUT# 1.4 - 7.7 K/uL 3.2  4.0  3.3   Lymph# 0.7 - 4.0 K/uL 2.5  2.0       There is no height or  weight on file to calculate BMI.  Orders:  No orders of the defined types were placed in this encounter.  No orders of the defined types were placed in this encounter.    Procedures: No procedures performed  Clinical Data: No additional findings.  ROS:  All other systems negative, except as noted in the HPI. Review of Systems  Objective: Vital Signs: There were no vitals taken for this visit.  Specialty Comments:  No specialty comments available.  PMFS History: Patient Active Problem List   Diagnosis Date Noted   Meralgia paraesthetica 09/04/2015   MENOPAUSE-RELATED VASOMOTOR SYMPTOMS, HOT FLASHES 03/19/2010   CHEST DISCOMFORT 02/14/2009   SHOULDER PAIN, RIGHT 11/29/2007   JOINT STIFFNESS 11/29/2007   WEIGHT GAIN 11/29/2007   PAP SMEAR, ABNORMAL 11/29/2007   Past Medical History:  Diagnosis Date   Allergy    Anxiety    Anxiety and depression    Arthritis    Depression    Migraine    Numbness    Left thigh    Family History  Problem Relation  Age of Onset   Lung cancer Mother    Heart disease Mother    Colon cancer Mother        ?   Stomach cancer Mother    Breast cancer Mother        after 75's   Heart disease Father    Kidney cancer Father    Colon cancer Father        ?   Diabetes Paternal Grandfather    Diabetes Maternal Grandfather    Colon polyps Neg Hx    Esophageal cancer Neg Hx    Rectal cancer Neg Hx     Past Surgical History:  Procedure Laterality Date   COLONOSCOPY  04-19-2010   normal-[facility]   COLONOSCOPY  09/21/2015   normal   COLONOSCOPY  02/20/2021   NO PAST SURGERIES     WISDOM TOOTH EXTRACTION     x4   Social History   Occupational History   Occupation: Airline pilot  Tobacco Use   Smoking status: Former   Smokeless tobacco: Never   Tobacco comments:    Quit 1980  Vaping Use   Vaping status: Never Used  Substance and Sexual Activity   Alcohol use: No    Alcohol/week: 0.0 standard drinks of alcohol   Drug use: No    Sexual activity: Not on file

## 2023-09-10 ENCOUNTER — Telehealth: Payer: Self-pay | Admitting: Orthopedic Surgery

## 2023-09-10 ENCOUNTER — Ambulatory Visit
Admission: RE | Admit: 2023-09-10 | Discharge: 2023-09-10 | Disposition: A | Discharge status: Home / Self Care | Payer: 59 | Source: Ambulatory Visit | Attending: Orthopedic Surgery | Admitting: Orthopedic Surgery

## 2023-09-10 DIAGNOSIS — M79671 Pain in right foot: Secondary | ICD-10-CM | POA: Diagnosis not present

## 2023-09-10 DIAGNOSIS — M19071 Primary osteoarthritis, right ankle and foot: Secondary | ICD-10-CM | POA: Diagnosis not present

## 2023-09-10 NOTE — Telephone Encounter (Signed)
Called pt to get MRI review scheduled and she stated if she could not get it by the end of the month she would not be able to come in there is going to be an issue with her being insured next year please advise she would like to know if she can get a call with the results

## 2023-09-10 NOTE — Progress Notes (Signed)
Also she does not need another appointment for follow-up thanks

## 2023-09-10 NOTE — Telephone Encounter (Signed)
I called.

## 2023-09-10 NOTE — Progress Notes (Signed)
I called.  Just some arthritis at the second tarsometatarsal joint.  Encouraged her to get shoes with very good arches.  If it is worse than that in terms of she needs more pain relief please have her see Dr. Lajoyce Corners thanks

## 2023-09-22 ENCOUNTER — Ambulatory Visit (INDEPENDENT_AMBULATORY_CARE_PROVIDER_SITE_OTHER): Payer: 59 | Admitting: Rehabilitative and Restorative Service Providers"

## 2023-09-22 ENCOUNTER — Encounter: Payer: Self-pay | Admitting: Rehabilitative and Restorative Service Providers"

## 2023-09-22 DIAGNOSIS — M79605 Pain in left leg: Secondary | ICD-10-CM

## 2023-09-22 DIAGNOSIS — M5459 Other low back pain: Secondary | ICD-10-CM

## 2023-09-22 NOTE — Therapy (Addendum)
 OUTPATIENT PHYSICAL THERAPY EVALUATION / DISCHARGE   Patient Name: Melody Duran MRN: [id number] DOB:November 24, 1960, 62 y.o., female Today's Date: 09/22/2023  END OF SESSION:  PT End of Session - 09/22/23 1252     Visit Number 1    Number of Visits 20    Date for PT Re-Evaluation 12/01/23    Authorization Type [employer] 30 PT visit, no copay    Progress Note Due on Visit 10    PT Start Time 1301    PT Stop Time 1326    PT Time Calculation (min) 25 min    Activity Tolerance Patient tolerated treatment well    Behavior During Therapy WFL for tasks assessed/performed             Past Medical History:  Diagnosis Date   Allergy    Anxiety    Anxiety and depression    Arthritis    Depression    Migraine    Numbness    Left thigh   Past Surgical History:  Procedure Laterality Date   COLONOSCOPY  04-19-2010   normal-[facility]   COLONOSCOPY  09/21/2015   normal   COLONOSCOPY  02/20/2021   NO PAST SURGERIES     WISDOM TOOTH EXTRACTION     x4   Patient Active Problem List   Diagnosis Date Noted   Low back pain 09/09/2023   Meralgia paraesthetica 09/04/2015   MENOPAUSE-RELATED VASOMOTOR SYMPTOMS, HOT FLASHES 03/19/2010   CHEST DISCOMFORT 02/14/2009   SHOULDER PAIN, RIGHT 11/29/2007   JOINT STIFFNESS 11/29/2007   WEIGHT GAIN 11/29/2007   PAP SMEAR, ABNORMAL 11/29/2007    PCP: Cleotilde Planas MD  REFERRING PROVIDER: Persons, Ronal Dragon, PA  REFERRING DIAG: [phone] (ICD-10-CM) - Pain in left leg M54.40 (ICD-10-CM) - Acute bilateral low back pain with sciatica, sciatica laterality unspecified  Rationale for Evaluation and Treatment: Rehabilitation  THERAPY DIAG:  Other low back pain  Pain in left leg  ONSET DATE: about 6 months  SUBJECTIVE:                                                                                                                                                                                           SUBJECTIVE STATEMENT: Complaints of pain at  night with history of back/leg pain.  Pt indicated not having the complaints during the day.  Occasional noted with prolonged sitting/driving.  Pt indicated most daily activity was not limited. Pt indicated lying flat was troublesome.  Reported disruption of sleeping.  Reported most every night.  Reported some pins and needle feeling in anterior thigh.  No numbness/tingling down to foot.  Reported stiffness in back.  Pt indicated having episode about 10 years ago that improved over 8 months.    PERTINENT HISTORY:  Med hx: anxiety, depression, history of migraines  PAIN:  NPRS scale: at worst 10/10 Pain location: back tightness, Lt anterior thigh Pain description: sharp, stabbing.  Aggravating factors: sleeping/lying on back.  Relieving factors: moving  PRECAUTIONS: None  WEIGHT BEARING RESTRICTIONS: No  FALLS:  Has patient fallen in last 6 months? Yes 1.   No fear of falling.   LIVING ENVIRONMENT:  Lives in: House/apartment  OCCUPATION: Dog sitting  PLOF: Independent  PATIENT GOALS: Sleep better  OBJECTIVE:   IMAGING 09/22/2023 review: Xray lumbar 09/09/2023 Radiographs of her lumbar spine demonstrate mild facet arthropathy no  acute fractures little degenerative changes most noted at L5-S1   Recent MRI foot Rt:   IMPRESSION: 1. No acute abnormality. Negative for stress change. 2. 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  PATIENT SURVEYS:  09/22/2023 FOTO eval: 97   predicted:  92  SCREENING FOR RED FLAGS: 09/22/2023 Bowel or bladder incontinence: No Cauda equina syndrome: No  COGNITION: 09/22/2023 Overall cognitive status: WFL normal      SENSATION: 09/22/2023 [facility]  MUSCLE LENGTH: 09/22/2023  Thomas test: Right (-) , Left + for rectus  POSTURE:  09/22/2023 Unremarkable in standing   PALPATION: 09/22/2023 Mild tenderness increase in Lt quad   LUMBAR ROM:  09/22/2023 Directional Preference Assessment: Centralization:  negative Peripheralization:  negative   AROM 09/22/2023  Flexion To knee joint with tightness in back and legs   Extension 75% WFL c tightness  Right lateral flexion   Left lateral flexion   Right rotation   Left rotation    (Blank rows = not tested)  LOWER EXTREMITY ROM:      Right 09/22/2023 Left 09/22/2023  Hip flexion    Hip extension    Hip abduction    Hip adduction    Hip internal rotation    Hip external rotation    Knee flexion    Knee extension    Ankle dorsiflexion    Ankle plantarflexion    Ankle inversion    Ankle eversion     (Blank rows = not tested)  LOWER EXTREMITY MMT:    MMT Right 09/22/2023 Left 09/22/2023  Hip flexion 5/5 5/5  Hip extension    Hip abduction    Hip adduction    Hip internal rotation    Hip external rotation    Knee flexion 5/5 5/5  Knee extension 5/5 5/5  Ankle dorsiflexion 5/5 5/5  Ankle plantarflexion    Ankle inversion    Ankle eversion     (Blank rows = not tested)  LUMBAR SPECIAL TESTS:  09/22/2023 No specific testing.   FUNCTIONAL TESTS:  09/22/2023 Able to perform 18 inch chair sit to stand to sit without UE , without pain  GAIT: 09/22/2023 Independent  TODAY'S TREATMENT:                                                                                                         DATE: 09/22/2023  Therex:    HEP instruction/performance c cues for techniques, handout provided.  Trial set performed of each for comprehension and symptom assessment.  See below for exercise list  PATIENT EDUCATION:  09/22/2023 Education details: HEP, POC Person educated: Patient Education method: Explanation, Demonstration, Verbal cues, and Handouts Education comprehension: verbalized understanding, returned demonstration,  and verbal cues required  HOME EXERCISE PROGRAM: Access Code: [email or web address] URL: [email or web address] Date: 09/22/2023 Prepared by: Ozell Silvan  Exercises - Standing Lumbar Extension with Counter  - 2-3 x daily - 7 x weekly - 1 sets - 5 reps - Supine Lower Trunk Rotation  - 2-3 x daily - 7 x weekly - 1 sets - 3-5 reps - 15 hold - Hip Flexor Stretch at Edge of Bed  - 1-2 x daily - 7 x weekly - 1 sets - 3-5 reps - 15 hold - Supine Bridge  - 1-2 x daily - 7 x weekly - 1-2 sets - 10 reps - 2 hold  ASSESSMENT:  CLINICAL IMPRESSION: Patient is a 62 y.o. who comes to clinic with complaints of back pain with Lt leg pain with mobility, strength and movement coordination deficits that impair their ability to perform usual daily and recreational functional activities without increase difficulty/symptoms at this time.  Patient to benefit from skilled PT services to address impairments and limitations to improve to previous level of function without restriction secondary to condition.   OBJECTIVE IMPAIRMENTS: decreased mobility, decreased ROM, increased fascial restrictions, impaired perceived functional ability, impaired flexibility, and pain.   ACTIVITY LIMITATIONS: sitting and sleeping  PARTICIPATION LIMITATIONS: interpersonal relationship, driving, and sleeping  PERSONAL FACTORS:  no factors  are also affecting patient's functional outcome.   REHAB POTENTIAL: Good  CLINICAL DECISION MAKING: Stable/uncomplicated  EVALUATION COMPLEXITY: Low   GOALS: Goals reviewed with patient? Yes  SHORT TERM GOALS: (target date for Short term goals are 3 weeks 10/13/2023)  1. Patient will demonstrate independent use of home exercise program to maintain progress from in clinic treatments.  Goal status: New  LONG TERM GOALS: (target dates for all long term goals are 10 weeks  12/01/2023 )   1. Patient will demonstrate/report pain at worst less than or equal to 2/10 to facilitate minimal  limitation in daily activity secondary to pain symptoms.  Goal status: New   2. Patient will demonstrate independent use of home exercise program to facilitate ability to maintain/progress functional gains from skilled physical therapy services.  Goal status: New   3. Patient will demonstrate FOTO outcome > or = 92 % to indicate reduced disability due to condition.  Goal status: New   4. Patient will demonstrate lumbar extension 100 % WFL s symptoms to facilitate upright standing, walking posture at PLOF s limitation.  Goal status: New   5.  Patient will demonstrate Lt thomas test equal to Rt for mobility improvement.  Goal status: New   6.  Patient will demonstrate/report ability to sleep s restriction.  Goal status: New    PLAN:  PT FREQUENCY: up to 1-2x/week  PT DURATION: 10 weeks  PLANNED INTERVENTIONS: Can include 02853- PT Re-evaluation, 97110-Therapeutic exercises, 97530- Therapeutic activity, 97112- Neuromuscular re-education, 97535- Self Care, 97140- Manual therapy, [phone]- Gait training, [phone]- Orthotic Fit/training, [id number]- Canalith repositioning, [id number]- Aquatic Therapy, 97014- Electrical stimulation (unattended), [id number]- Electrical stimulation (manual), [id number]- Vasopneumatic device, [id number]- Ultrasound, [id number]- Traction (mechanical), [id number]- Ionotophoresis 4mg /ml Dexamethasone, Patient/Family education, Balance training, Stair training, Taping, Dry Needling, Joint mobilization, Joint manipulation, Spinal manipulation, Spinal mobilization, Scar mobilization, Vestibular training, Visual/preceptual remediation/compensation, DME instructions, Cryotherapy, and Moist heat.  All performed as medically necessary.  All included unless contraindicated  PLAN FOR NEXT SESSION: Review HEP knowledge/results.    Ozell Silvan, PT, DPT, OCS, ATC 09/22/23  1:28 PM   PHYSICAL THERAPY DISCHARGE SUMMARY  Visits from Start of Care: 1  Current functional level related to goals / functional  outcomes: See note   Remaining deficits: See note   Education / Equipment: HEP  Patient goals were not met. Patient is being discharged due to the patient's request. Due to cost   Ozell Silvan, PT, DPT, OCS, ATC 10/06/23  11:01 AM

## 2023-10-06 ENCOUNTER — Encounter: Payer: 59 | Admitting: Rehabilitative and Restorative Service Providers"

## 2023-10-22 DIAGNOSIS — D225 Melanocytic nevi of trunk: Secondary | ICD-10-CM | POA: Diagnosis not present

## 2023-10-22 DIAGNOSIS — Z85828 Personal history of other malignant neoplasm of skin: Secondary | ICD-10-CM | POA: Diagnosis not present

## 2023-10-22 DIAGNOSIS — L821 Other seborrheic keratosis: Secondary | ICD-10-CM | POA: Diagnosis not present

## 2023-10-22 DIAGNOSIS — L570 Actinic keratosis: Secondary | ICD-10-CM | POA: Diagnosis not present

## 2023-10-22 DIAGNOSIS — L111 Transient acantholytic dermatosis [Grover]: Secondary | ICD-10-CM | POA: Diagnosis not present

## 2023-12-07 DIAGNOSIS — Z809 Family history of malignant neoplasm, unspecified: Secondary | ICD-10-CM | POA: Diagnosis not present

## 2023-12-07 DIAGNOSIS — Z7984 Long term (current) use of oral hypoglycemic drugs: Secondary | ICD-10-CM | POA: Diagnosis not present

## 2023-12-07 DIAGNOSIS — Z8249 Family history of ischemic heart disease and other diseases of the circulatory system: Secondary | ICD-10-CM | POA: Diagnosis not present

## 2023-12-07 DIAGNOSIS — Z9181 History of falling: Secondary | ICD-10-CM | POA: Diagnosis not present

## 2023-12-07 DIAGNOSIS — M1990 Unspecified osteoarthritis, unspecified site: Secondary | ICD-10-CM | POA: Diagnosis not present

## 2023-12-07 DIAGNOSIS — Z7985 Long-term (current) use of injectable non-insulin antidiabetic drugs: Secondary | ICD-10-CM | POA: Diagnosis not present

## 2023-12-07 DIAGNOSIS — Z85828 Personal history of other malignant neoplasm of skin: Secondary | ICD-10-CM | POA: Diagnosis not present

## 2023-12-07 DIAGNOSIS — Z6833 Body mass index (BMI) 33.0-33.9, adult: Secondary | ICD-10-CM | POA: Diagnosis not present

## 2023-12-07 DIAGNOSIS — F324 Major depressive disorder, single episode, in partial remission: Secondary | ICD-10-CM | POA: Diagnosis not present

## 2023-12-07 DIAGNOSIS — M545 Low back pain, unspecified: Secondary | ICD-10-CM | POA: Diagnosis not present

## 2023-12-07 DIAGNOSIS — Z9849 Cataract extraction status, unspecified eye: Secondary | ICD-10-CM | POA: Diagnosis not present

## 2023-12-07 DIAGNOSIS — Z833 Family history of diabetes mellitus: Secondary | ICD-10-CM | POA: Diagnosis not present

## 2024-08-11 ENCOUNTER — Other Ambulatory Visit: Payer: Self-pay | Admitting: Family Medicine

## 2024-08-11 DIAGNOSIS — Z1231 Encounter for screening mammogram for malignant neoplasm of breast: Secondary | ICD-10-CM

## 2024-08-26 DIAGNOSIS — N1831 Chronic kidney disease, stage 3a: Secondary | ICD-10-CM | POA: Diagnosis not present

## 2024-08-26 DIAGNOSIS — Z87898 Personal history of other specified conditions: Secondary | ICD-10-CM | POA: Diagnosis not present

## 2024-08-26 DIAGNOSIS — E669 Obesity, unspecified: Secondary | ICD-10-CM | POA: Diagnosis not present

## 2024-08-26 DIAGNOSIS — E038 Other specified hypothyroidism: Secondary | ICD-10-CM | POA: Diagnosis not present

## 2024-08-26 DIAGNOSIS — Z0000 Encounter for general adult medical examination without abnormal findings: Secondary | ICD-10-CM | POA: Diagnosis not present

## 2024-08-26 DIAGNOSIS — F5101 Primary insomnia: Secondary | ICD-10-CM | POA: Diagnosis not present

## 2024-09-07 ENCOUNTER — Inpatient Hospital Stay: Admission: RE | Admit: 2024-09-07 | Discharge: 2024-09-07 | Attending: Family Medicine | Admitting: Family Medicine

## 2024-09-07 DIAGNOSIS — Z1231 Encounter for screening mammogram for malignant neoplasm of breast: Secondary | ICD-10-CM

## 2024-09-08 ENCOUNTER — Other Ambulatory Visit (HOSPITAL_BASED_OUTPATIENT_CLINIC_OR_DEPARTMENT_OTHER): Payer: Self-pay | Admitting: Family Medicine

## 2024-09-08 DIAGNOSIS — E7800 Pure hypercholesterolemia, unspecified: Secondary | ICD-10-CM

## 2024-09-28 ENCOUNTER — Ambulatory Visit (HOSPITAL_BASED_OUTPATIENT_CLINIC_OR_DEPARTMENT_OTHER)
Admission: RE | Admit: 2024-09-28 | Discharge: 2024-09-28 | Disposition: A | Discharge status: Home / Self Care | Payer: Self-pay | Source: Ambulatory Visit | Attending: Family Medicine | Admitting: Family Medicine

## 2024-09-28 DIAGNOSIS — E7800 Pure hypercholesterolemia, unspecified: Secondary | ICD-10-CM | POA: Insufficient documentation
